# Patient Record
Sex: Female | Born: 1965 | Race: White | Hispanic: No | Marital: Married | Smoking: Never smoker
Health system: Southern US, Community
[De-identification: ages and names within clinical notes are randomized; demographics above are authoritative.]

## PROBLEM LIST (undated history)

## (undated) DIAGNOSIS — D219 Benign neoplasm of connective and other soft tissue, unspecified: Secondary | ICD-10-CM

## (undated) DIAGNOSIS — E7800 Pure hypercholesterolemia, unspecified: Secondary | ICD-10-CM

## (undated) DIAGNOSIS — E079 Disorder of thyroid, unspecified: Secondary | ICD-10-CM

## (undated) DIAGNOSIS — E042 Nontoxic multinodular goiter: Secondary | ICD-10-CM

## (undated) DIAGNOSIS — E559 Vitamin D deficiency, unspecified: Secondary | ICD-10-CM

## (undated) DIAGNOSIS — K812 Acute cholecystitis with chronic cholecystitis: Secondary | ICD-10-CM

## (undated) DIAGNOSIS — R5383 Other fatigue: Secondary | ICD-10-CM

## (undated) DIAGNOSIS — R1013 Epigastric pain: Secondary | ICD-10-CM

## (undated) DIAGNOSIS — N83201 Unspecified ovarian cyst, right side: Secondary | ICD-10-CM

## (undated) DIAGNOSIS — K219 Gastro-esophageal reflux disease without esophagitis: Secondary | ICD-10-CM

## (undated) DIAGNOSIS — E063 Autoimmune thyroiditis: Secondary | ICD-10-CM

## (undated) HISTORY — DX: Nontoxic multinodular goiter: E04.2

## (undated) HISTORY — PX: ABDOMINAL HYSTERECTOMY: SHX81

## (undated) HISTORY — DX: Epigastric pain: R10.13

## (undated) HISTORY — PX: TEMPOROMANDIBULAR JOINT SURGERY: SHX35

## (undated) HISTORY — DX: Autoimmune thyroiditis: E06.3

## (undated) HISTORY — DX: Gastro-esophageal reflux disease without esophagitis: K21.9

## (undated) HISTORY — DX: Acute cholecystitis with chronic cholecystitis: K81.2

## (undated) HISTORY — DX: Other fatigue: R53.83

## (undated) HISTORY — DX: Vitamin D deficiency, unspecified: E55.9

## (undated) HISTORY — DX: Pure hypercholesterolemia, unspecified: E78.00

## (undated) HISTORY — DX: Disorder of thyroid, unspecified: E07.9

## (undated) HISTORY — DX: Unspecified ovarian cyst, right side: N83.201

## (undated) HISTORY — DX: Benign neoplasm of connective and other soft tissue, unspecified: D21.9

---

## 1998-04-23 ENCOUNTER — Ambulatory Visit (HOSPITAL_BASED_OUTPATIENT_CLINIC_OR_DEPARTMENT_OTHER): Admission: RE | Admit: 1998-04-23 | Discharge: 1998-04-23 | Payer: Self-pay | Admitting: Surgery

## 1998-05-23 ENCOUNTER — Ambulatory Visit (HOSPITAL_COMMUNITY): Admission: RE | Admit: 1998-05-23 | Discharge: 1998-05-23 | Payer: Self-pay | Admitting: Surgery

## 1998-05-28 ENCOUNTER — Other Ambulatory Visit: Admission: RE | Admit: 1998-05-28 | Discharge: 1998-05-28 | Payer: Self-pay | Admitting: Gynecology

## 1998-12-07 ENCOUNTER — Other Ambulatory Visit: Admission: RE | Admit: 1998-12-07 | Discharge: 1998-12-07 | Payer: Self-pay | Admitting: Gynecology

## 1999-07-17 ENCOUNTER — Other Ambulatory Visit: Admission: RE | Admit: 1999-07-17 | Discharge: 1999-07-17 | Payer: Self-pay | Admitting: Gynecology

## 2000-09-08 ENCOUNTER — Other Ambulatory Visit: Admission: RE | Admit: 2000-09-08 | Discharge: 2000-09-08 | Payer: Self-pay | Admitting: Gynecology

## 2001-08-10 ENCOUNTER — Other Ambulatory Visit: Admission: RE | Admit: 2001-08-10 | Discharge: 2001-08-10 | Payer: Self-pay | Admitting: Gynecology

## 2001-09-28 ENCOUNTER — Ambulatory Visit (HOSPITAL_COMMUNITY): Admission: RE | Admit: 2001-09-28 | Discharge: 2001-09-28 | Payer: Self-pay | Admitting: Urology

## 2001-09-28 ENCOUNTER — Encounter: Payer: Self-pay | Admitting: Urology

## 2002-08-26 ENCOUNTER — Other Ambulatory Visit: Admission: RE | Admit: 2002-08-26 | Discharge: 2002-08-26 | Payer: Self-pay | Admitting: Obstetrics and Gynecology

## 2003-09-21 ENCOUNTER — Other Ambulatory Visit: Admission: RE | Admit: 2003-09-21 | Discharge: 2003-09-21 | Payer: Self-pay | Admitting: Obstetrics and Gynecology

## 2003-10-25 ENCOUNTER — Encounter: Admission: RE | Admit: 2003-10-25 | Discharge: 2003-10-25 | Payer: Self-pay | Admitting: Internal Medicine

## 2004-07-11 ENCOUNTER — Ambulatory Visit (HOSPITAL_COMMUNITY): Admission: RE | Admit: 2004-07-11 | Discharge: 2004-07-11 | Payer: Self-pay | Admitting: Internal Medicine

## 2004-08-02 ENCOUNTER — Encounter: Admission: RE | Admit: 2004-08-02 | Discharge: 2004-08-02 | Payer: Self-pay | Admitting: Internal Medicine

## 2004-10-14 ENCOUNTER — Other Ambulatory Visit: Admission: RE | Admit: 2004-10-14 | Discharge: 2004-10-14 | Payer: Self-pay | Admitting: Obstetrics and Gynecology

## 2005-03-24 ENCOUNTER — Ambulatory Visit: Payer: Self-pay | Admitting: "Endocrinology

## 2005-03-28 ENCOUNTER — Ambulatory Visit (HOSPITAL_COMMUNITY): Admission: RE | Admit: 2005-03-28 | Discharge: 2005-03-28 | Payer: Self-pay | Admitting: "Endocrinology

## 2005-04-04 ENCOUNTER — Ambulatory Visit: Payer: Self-pay | Admitting: "Endocrinology

## 2005-06-02 ENCOUNTER — Ambulatory Visit: Payer: Self-pay | Admitting: "Endocrinology

## 2005-08-12 ENCOUNTER — Ambulatory Visit: Payer: Self-pay | Admitting: "Endocrinology

## 2005-10-06 ENCOUNTER — Encounter (INDEPENDENT_AMBULATORY_CARE_PROVIDER_SITE_OTHER): Payer: Self-pay | Admitting: Specialist

## 2005-10-06 ENCOUNTER — Inpatient Hospital Stay (HOSPITAL_COMMUNITY): Admission: RE | Admit: 2005-10-06 | Discharge: 2005-10-08 | Payer: Self-pay | Admitting: Obstetrics and Gynecology

## 2005-11-03 ENCOUNTER — Ambulatory Visit: Payer: Self-pay | Admitting: "Endocrinology

## 2006-01-23 ENCOUNTER — Ambulatory Visit: Payer: Self-pay | Admitting: "Endocrinology

## 2006-04-27 ENCOUNTER — Ambulatory Visit: Payer: Self-pay | Admitting: "Endocrinology

## 2006-08-13 ENCOUNTER — Ambulatory Visit: Payer: Self-pay | Admitting: "Endocrinology

## 2006-08-26 ENCOUNTER — Encounter: Admission: RE | Admit: 2006-08-26 | Discharge: 2006-08-26 | Payer: Self-pay | Admitting: Gastroenterology

## 2006-11-25 ENCOUNTER — Other Ambulatory Visit: Admission: RE | Admit: 2006-11-25 | Discharge: 2006-11-25 | Payer: Self-pay | Admitting: Obstetrics and Gynecology

## 2006-12-14 ENCOUNTER — Ambulatory Visit: Payer: Self-pay | Admitting: "Endocrinology

## 2006-12-29 ENCOUNTER — Encounter: Admission: RE | Admit: 2006-12-29 | Discharge: 2006-12-29 | Payer: Self-pay | Admitting: Gastroenterology

## 2007-01-04 ENCOUNTER — Ambulatory Visit (HOSPITAL_COMMUNITY): Admission: RE | Admit: 2007-01-04 | Discharge: 2007-01-04 | Payer: Self-pay | Admitting: Gastroenterology

## 2007-03-15 ENCOUNTER — Ambulatory Visit: Payer: Self-pay | Admitting: "Endocrinology

## 2007-07-29 ENCOUNTER — Ambulatory Visit: Payer: Self-pay | Admitting: "Endocrinology

## 2007-11-01 ENCOUNTER — Ambulatory Visit: Payer: Self-pay | Admitting: "Endocrinology

## 2008-04-19 ENCOUNTER — Other Ambulatory Visit: Admission: RE | Admit: 2008-04-19 | Discharge: 2008-04-19 | Payer: Self-pay | Admitting: Obstetrics and Gynecology

## 2008-06-28 ENCOUNTER — Ambulatory Visit: Payer: Self-pay | Admitting: "Endocrinology

## 2008-09-21 ENCOUNTER — Ambulatory Visit: Payer: Self-pay | Admitting: Obstetrics and Gynecology

## 2008-11-27 ENCOUNTER — Ambulatory Visit: Payer: Self-pay | Admitting: "Endocrinology

## 2009-03-21 ENCOUNTER — Ambulatory Visit: Payer: Self-pay | Admitting: "Endocrinology

## 2009-05-01 ENCOUNTER — Other Ambulatory Visit: Admission: RE | Admit: 2009-05-01 | Discharge: 2009-05-01 | Payer: Self-pay | Admitting: Obstetrics and Gynecology

## 2009-05-01 ENCOUNTER — Encounter: Payer: Self-pay | Admitting: Obstetrics and Gynecology

## 2009-05-01 ENCOUNTER — Ambulatory Visit: Payer: Self-pay | Admitting: Obstetrics and Gynecology

## 2009-05-25 ENCOUNTER — Ambulatory Visit: Payer: Self-pay | Admitting: Obstetrics and Gynecology

## 2009-10-08 ENCOUNTER — Ambulatory Visit: Payer: Self-pay | Admitting: "Endocrinology

## 2010-02-10 ENCOUNTER — Encounter: Admission: RE | Admit: 2010-02-10 | Discharge: 2010-02-10 | Payer: Self-pay | Admitting: Family Medicine

## 2010-02-24 ENCOUNTER — Encounter: Admission: RE | Admit: 2010-02-24 | Discharge: 2010-02-24 | Payer: Self-pay | Admitting: Family Medicine

## 2010-04-22 ENCOUNTER — Ambulatory Visit: Payer: Self-pay | Admitting: Women's Health

## 2010-04-23 ENCOUNTER — Encounter: Admission: RE | Admit: 2010-04-23 | Discharge: 2010-04-23 | Payer: Self-pay | Admitting: Obstetrics and Gynecology

## 2010-06-18 ENCOUNTER — Other Ambulatory Visit: Admission: RE | Admit: 2010-06-18 | Discharge: 2010-06-18 | Payer: Self-pay | Admitting: Obstetrics and Gynecology

## 2010-06-18 ENCOUNTER — Ambulatory Visit: Payer: Self-pay | Admitting: Obstetrics and Gynecology

## 2010-06-28 ENCOUNTER — Ambulatory Visit: Payer: Self-pay | Admitting: Obstetrics and Gynecology

## 2010-07-01 ENCOUNTER — Ambulatory Visit: Payer: Self-pay | Admitting: Women's Health

## 2010-07-01 ENCOUNTER — Ambulatory Visit (HOSPITAL_COMMUNITY): Admission: RE | Admit: 2010-07-01 | Discharge: 2010-07-01 | Payer: Self-pay | Admitting: Obstetrics and Gynecology

## 2010-07-11 ENCOUNTER — Ambulatory Visit: Payer: Self-pay | Admitting: Women's Health

## 2010-08-05 ENCOUNTER — Ambulatory Visit: Payer: Self-pay | Admitting: "Endocrinology

## 2010-12-08 ENCOUNTER — Encounter: Payer: Self-pay | Admitting: Internal Medicine

## 2010-12-08 ENCOUNTER — Encounter: Payer: Self-pay | Admitting: Obstetrics and Gynecology

## 2011-03-12 ENCOUNTER — Encounter: Payer: Self-pay | Admitting: *Deleted

## 2011-03-12 ENCOUNTER — Other Ambulatory Visit: Payer: Self-pay | Admitting: *Deleted

## 2011-03-12 DIAGNOSIS — E038 Other specified hypothyroidism: Secondary | ICD-10-CM

## 2011-03-12 DIAGNOSIS — E7800 Pure hypercholesterolemia, unspecified: Secondary | ICD-10-CM | POA: Insufficient documentation

## 2011-03-12 DIAGNOSIS — E559 Vitamin D deficiency, unspecified: Secondary | ICD-10-CM

## 2011-04-04 NOTE — Op Note (Signed)
[name], [name]                  ACCOUNT NO.:  [id number]   MEDICAL RECORD NO.:  [id number]          PATIENT TYPE:  INP   LOCATION:  9304                          FACILITY:  [facility]   PHYSICIAN:  Daniel L. Gottsegen, M.D.DATE OF BIRTH:  1966-10-17   DATE OF PROCEDURE:  10/06/2005  DATE OF DISCHARGE:                                 OPERATIVE REPORT   PREOPERATIVE DIAGNOSIS:  Symptomatic fibroids.   POSTOPERATIVE DIAGNOSIS:  Symptomatic fibroids.   OPERATIONS:  Total abdominal hysterectomy.   SURGEON:  Dr. Eda Paschal   FIRST ASSISTANT:  Dr. Audie Box   FINDINGS:  The patient's uterus was enlarged by multiple fibroids, 12-14  weeks size. Ovaries, fallopian tubes, pelvic peritoneum and rest of the  exploration of the abdomen was completely unremarkable.   PROCEDURE:  The patient was taken to the operating room, placed in the  supine position and given general anesthesia. At this point attempt was made  to intubate the patient. It was extremely difficult intubation. It was  finally done with a fiberoptic assistance, please see anesthesia record for  more details. Once the patient had been adequately intubated. She was  prepped and draped in usual sterile manner. A Foley catheter was inserted in  the patient's bladder. Pfannenstiel incision was made. The fascia was opened  transversely. The peritoneum was entered vertically. Subcutaneous bleeders  were clamped and bovied as encountered. Round ligaments, utero-ovarian  ligaments and fallopian tubes were clamped, cut and doubly suture ligated  with #1 chromic catgut. The bladder flap was taken down by sharp dissection  as was the posterior peritoneum. The uterine arteries were doubly ligated  with #1 chromic catgut. The parametrium was taken down in successive bites  by clamping, cutting and suture ligating with #1 chromic catgut. Cervical  vaginal junction was identified entered with sharp dissection and the uterus  was sent to pathology  for tissue diagnosis. Angle sutures placed in the  angles of the vagina with #1 chromic catgut incorporating cardinal ligaments  and uterosacral ligaments for good vault support. The cuff was closed with  figure-of-eights of 0 Vicryl. Two sponge, needle, and instrument counts were  correct. Copious irrigation was done with sterile saline. The peritoneum was  closed with a running 0 Vicryl. The fascia was closed with two running 0  Vicryl. The skin was closed with staples. Estimated blood loss for entire  procedure was 250 mL with none replaced. The patient tolerated procedure  well. At the end of the procedure was draining clear urine from her Foley  catheter.      Daniel L. Eda Paschal, M.D.  Electronically Signed     DLG/MEDQ  D:  10/06/2005  T:  10/06/2005  Job:  [id number]

## 2011-04-04 NOTE — H&P (Signed)
[name], [name]                  ACCOUNT NO.:  [id number]   MEDICAL RECORD NO.:  [id number]           PATIENT TYPE:   LOCATION:                                 FACILITY:   PHYSICIAN:  Daniel L. Eda Paschal, M.D.   DATE OF BIRTH:   DATE OF ADMISSION:  10/05/2005  DATE OF DISCHARGE:                                HISTORY & PHYSICAL   CHIEF COMPLAINT:  Symptomatic fibroids.   HISTORY OF PRESENT ILLNESS:  Patient is a 45 year old gravida 2, para 2, AB0  who has started to have significant symptoms from fibroids which have grown  over the past year.  Her symptoms include persistent pelvic discomfort,  persistent pressure on her bladder, abdominal bloating, feeling that  everything is actually falling out, aggravation of her interstitial  cystitis.  These symptoms were confirmed with findings of multiple fibroids.  On ultrasound patient has at least six fairly good sized fibroids.  Her  ovaries appeared normal.  She had been seen initially by Dr. Carlis [name] in  [facility] who felt that her symptoms were due to fibroids and recommended  hysterectomy.  She was seen by me and I concurred in the above.  As a result  of this she now enters the hospital for total abdominal hysterectomy.  We  plan to conserve both ovaries unless there is disease.  She has given me  permission to remove one or both ovaries for very, very significant disease.   PAST MEDICAL HISTORY:  1.  Patient is status post cesarean section and also surgery for TMJ.  2.  She is hypothyroid.  3.  She has GERD.  4.  She has multiple allergies.   CURRENT MEDICATIONS:  1.  Allegra.  2.  Lexapro 20 mg daily.  3.  Synthroid 50 mcg daily.  4.  Nexium 40 mg daily.   She is allergic to PENICILLIN, BIAXIN, DOXYCYCLINE, CODEINE, and ADHESIVE  TAPE.   FAMILY HISTORY:  Grandfather with coronary artery disease, otherwise family  history is basically noncontributory.   SOCIAL HISTORY:  She is a nonsmoker, nondrinker.   REVIEW  OF SYSTEMS:  HEENT:  Negative except for allergies.  CARDIAC:  Negative.  RESPIRATORY:  Negative.  GI:  Reflux.  GU:  Negative except for  interstitial cystitis.  NEUROMUSCULAR, ENDOCRINE, ALLERGIES:  See above.   PHYSICAL EXAMINATION:  GENERAL:  Patient is a well-developed and well-  nourished female in no acute distress.  VITAL SIGNS:  Blood pressure 130/70, her pulse is 80 and regular,  respirations 16 and nonlabored.  She is afebrile.  HEENT:  All within normal limits.  NECK:  Supple.  Trachea in the midline.  Thyroid not enlarged.  LUNGS:  Clear to P&A.  HEART:  No thrills, heaves, or murmurs.  BREASTS:  No masses.  ABDOMEN:  Soft without guarding, rebound, or masses.  PELVIC:  External and vaginal is normal.  Cervix is clean.  Pap smear showed  no atypia.  Uterus is enlarged by fibroids 10-11 weeks size.  Adnexa are  palpably normal.  RECTAL:  Negative.  EXTREMITIES:  Within normal limits.   ADMISSION IMPRESSION:  Symptomatic fibroids.   PLAN:  Total abdominal hysterectomy.      Daniel L. Eda Paschal, M.D.  Electronically Signed     DLG/MEDQ  D:  10/06/2005  T:  10/06/2005  Job:  [id number]

## 2011-04-16 ENCOUNTER — Other Ambulatory Visit: Payer: Self-pay | Admitting: *Deleted

## 2011-04-16 DIAGNOSIS — E038 Other specified hypothyroidism: Secondary | ICD-10-CM

## 2011-04-19 LAB — LIPID PANEL: VLDL: 10 mg/dL (ref 0–40)

## 2011-04-22 ENCOUNTER — Ambulatory Visit: Payer: Self-pay | Admitting: "Endocrinology

## 2011-04-23 ENCOUNTER — Telehealth: Payer: Self-pay | Admitting: *Deleted

## 2011-04-23 NOTE — Telephone Encounter (Signed)
Patient notified by phone of results.

## 2011-05-26 ENCOUNTER — Ambulatory Visit (INDEPENDENT_AMBULATORY_CARE_PROVIDER_SITE_OTHER): Payer: BC Managed Care – PPO | Admitting: "Endocrinology

## 2011-05-26 VITALS — BP 96/53 | HR 109 | Wt 142.6 lb

## 2011-05-26 DIAGNOSIS — R5381 Other malaise: Secondary | ICD-10-CM

## 2011-05-26 DIAGNOSIS — E049 Nontoxic goiter, unspecified: Secondary | ICD-10-CM

## 2011-05-26 DIAGNOSIS — E063 Autoimmune thyroiditis: Secondary | ICD-10-CM

## 2011-05-26 DIAGNOSIS — E7800 Pure hypercholesterolemia, unspecified: Secondary | ICD-10-CM

## 2011-05-26 DIAGNOSIS — R5383 Other fatigue: Secondary | ICD-10-CM

## 2011-05-26 DIAGNOSIS — E038 Other specified hypothyroidism: Secondary | ICD-10-CM

## 2011-05-26 DIAGNOSIS — E7801 Familial hypercholesterolemia: Secondary | ICD-10-CM

## 2011-05-26 NOTE — Patient Instructions (Signed)
Please have thyrorid lab tests done in two months. Please have thyroid labs and lipids drawn tne week prior to next appointment. Please fast after 10 PM on night prior to the second set of labs tests.

## 2011-06-20 ENCOUNTER — Other Ambulatory Visit (HOSPITAL_COMMUNITY): Payer: Self-pay | Admitting: Gastroenterology

## 2011-06-20 DIAGNOSIS — R1084 Generalized abdominal pain: Secondary | ICD-10-CM

## 2011-06-27 ENCOUNTER — Encounter (HOSPITAL_COMMUNITY)
Admission: RE | Admit: 2011-06-27 | Discharge: 2011-06-27 | Disposition: A | Discharge status: Home / Self Care | Payer: BC Managed Care – PPO | Source: Ambulatory Visit | Attending: Gastroenterology | Admitting: Gastroenterology

## 2011-06-27 DIAGNOSIS — R1084 Generalized abdominal pain: Secondary | ICD-10-CM | POA: Insufficient documentation

## 2011-06-27 MED ORDER — TECHNETIUM TC 99M MEBROFENIN IV KIT
5.0000 | PACK | Freq: Once | INTRAVENOUS | Status: AC | PRN
  Administered 2011-06-27: 5 via INTRAVENOUS

## 2011-07-07 ENCOUNTER — Encounter: Payer: Self-pay | Admitting: Gynecology

## 2011-07-07 DIAGNOSIS — E079 Disorder of thyroid, unspecified: Secondary | ICD-10-CM | POA: Insufficient documentation

## 2011-07-07 DIAGNOSIS — N94819 Vulvodynia, unspecified: Secondary | ICD-10-CM | POA: Insufficient documentation

## 2011-07-07 DIAGNOSIS — E7800 Pure hypercholesterolemia, unspecified: Secondary | ICD-10-CM | POA: Insufficient documentation

## 2011-07-07 DIAGNOSIS — D219 Benign neoplasm of connective and other soft tissue, unspecified: Secondary | ICD-10-CM | POA: Insufficient documentation

## 2011-07-16 ENCOUNTER — Encounter: Payer: Self-pay | Admitting: Obstetrics and Gynecology

## 2011-07-16 ENCOUNTER — Ambulatory Visit (INDEPENDENT_AMBULATORY_CARE_PROVIDER_SITE_OTHER): Payer: BC Managed Care – PPO | Admitting: Obstetrics and Gynecology

## 2011-07-16 ENCOUNTER — Other Ambulatory Visit (HOSPITAL_COMMUNITY)
Admission: RE | Admit: 2011-07-16 | Discharge: 2011-07-16 | Disposition: A | Discharge status: Home / Self Care | Payer: BC Managed Care – PPO | Source: Ambulatory Visit | Attending: Obstetrics and Gynecology | Admitting: Obstetrics and Gynecology

## 2011-07-16 VITALS — BP 120/70 | Ht 61.5 in | Wt 143.0 lb

## 2011-07-16 DIAGNOSIS — Z01419 Encounter for gynecological examination (general) (routine) without abnormal findings: Secondary | ICD-10-CM

## 2011-07-16 DIAGNOSIS — N898 Other specified noninflammatory disorders of vagina: Secondary | ICD-10-CM

## 2011-07-16 DIAGNOSIS — K810 Acute cholecystitis: Secondary | ICD-10-CM | POA: Insufficient documentation

## 2011-07-16 DIAGNOSIS — R823 Hemoglobinuria: Secondary | ICD-10-CM

## 2011-07-16 DIAGNOSIS — B373 Candidiasis of vulva and vagina: Secondary | ICD-10-CM

## 2011-07-16 DIAGNOSIS — B3731 Acute candidiasis of vulva and vagina: Secondary | ICD-10-CM

## 2011-07-16 MED ORDER — TERCONAZOLE 0.8 % VA CREA: 1.0000 | TOPICAL_CREAM | Freq: Every day | VAGINAL | Status: AC

## 2011-07-16 NOTE — Progress Notes (Signed)
The patient came to see me today for an annual GYN exam. She is having some vaginal discharge which is her that normal. She is up-to-date on mammograms. Other than that she is no GYN problems. She did get sick on vacation and was diagnosed with acute cholecystitis with LFTs elevated. She is now seen by Jeannetta Ellis and it appears that she is getting better and will not need surgery.  HEENT: Within normal limits. Neck: No masses. Supraclavicular lymph nodes: Not enlarged. Breasts: Examined in both sitting and lying position. Symmetrical without skin changes or masses. Abdomen: Soft no masses guarding or rebound. No hernias. Pelvic: External within normal limits. BUS within normal limits. Vaginal examination shows good estrogen effect, no cystocele enterocele or rectocele. Wet prep positive for yeast. Cervix and uterus absent. Adnexa within normal limits. Rectovaginal confirmatory. Extremities within normal limits.  Assessment: Yeast vaginitis  Plan: Terconazole 3 cream.

## 2011-08-06 ENCOUNTER — Ambulatory Visit (INDEPENDENT_AMBULATORY_CARE_PROVIDER_SITE_OTHER): Payer: BC Managed Care – PPO | Admitting: Women's Health

## 2011-08-06 DIAGNOSIS — R823 Hemoglobinuria: Secondary | ICD-10-CM

## 2011-08-06 DIAGNOSIS — E079 Disorder of thyroid, unspecified: Secondary | ICD-10-CM

## 2011-08-06 DIAGNOSIS — M549 Dorsalgia, unspecified: Secondary | ICD-10-CM

## 2011-08-06 LAB — TSH: TSH: 1.01 u[IU]/mL (ref 0.350–4.500)

## 2011-08-06 LAB — T3, FREE: T3, Free: 3.1 pg/mL (ref 2.3–4.2)

## 2011-08-06 NOTE — Progress Notes (Signed)
  Presents with a complaint of right lower quadrant pain that's been off and on for several days, crampy, dull ache. Denies a fever, no nausea, vomiting or constipation. Has back discomfort especially in the lower lumbar area. She had pain similar to this about a year ago where she had a  CT scan with a normal appendix, but with questionable gallstones.She did have a pelvic ultrasound that did not show a cyst but she has had cysts in the past.She has followup with her GI Dr.Hayes on Monday  September 24,2012.  No CVAT, pain was more in the lumbar area. Abdomen was soft she did have discomfort in the lower right quadrant. External genitalia is within normal limits, speculum exam scant discharge no erythema or odor was noted. Bimanual she has had a hysterectomy for fibroids, no discomfort on the left adnexal area slight discomfort at the right above the area of the ovary.  UA rare wbc's will check a urine culture, will check a CBC,( TSH free T4 and free T3 her requested from her endocrinologist). Ultrasound to rule out ovarian cysts, keep scheduled followup with GI Dr. due to history of possible gallstones.

## 2011-08-08 ENCOUNTER — Ambulatory Visit (INDEPENDENT_AMBULATORY_CARE_PROVIDER_SITE_OTHER): Payer: BC Managed Care – PPO | Admitting: Women's Health

## 2011-08-08 ENCOUNTER — Ambulatory Visit: Payer: BC Managed Care – PPO

## 2011-08-08 DIAGNOSIS — N83209 Unspecified ovarian cyst, unspecified side: Secondary | ICD-10-CM

## 2011-08-08 DIAGNOSIS — M549 Dorsalgia, unspecified: Secondary | ICD-10-CM

## 2011-08-08 DIAGNOSIS — R1031 Right lower quadrant pain: Secondary | ICD-10-CM

## 2011-08-08 NOTE — Progress Notes (Signed)
  Was seen in the office on September 19 ,dull, achy discomfort in the lower right quadrant, and lower back area that radiated to her pelvis. She states she has had this pain off and on for one year. She did have a negative workup August of 2011, with CT, ultrasound, and did see her GI Dr.  Aubery Lapping today does show negative vaginal cuff history of hysterectomy for fibroids. Left ovary normal with the follicle. The right ovary a complex thin-walled avascular mass 42 x 32 x 41 mm questionable endometrioma.  Will check a CA 125, schedule a followup with Dr. Eda Paschal to discuss.

## 2011-08-09 LAB — CA 125: CA 125: 11 U/mL (ref 0.0–30.2)

## 2011-08-12 ENCOUNTER — Ambulatory Visit (INDEPENDENT_AMBULATORY_CARE_PROVIDER_SITE_OTHER): Payer: BC Managed Care – PPO | Admitting: Obstetrics and Gynecology

## 2011-08-12 DIAGNOSIS — N83209 Unspecified ovarian cyst, unspecified side: Secondary | ICD-10-CM

## 2011-08-12 DIAGNOSIS — M549 Dorsalgia, unspecified: Secondary | ICD-10-CM

## 2011-08-12 NOTE — Progress Notes (Signed)
Patient came back today discussed with her pain and her ultrasound findings. I told her that on ultrasound her cyst appeared benign. Her CA 125 was normal. She is having terrific upper and lower back pain. She had a previous attack when she was in [place] of we'll was thought to be gallbladder disease. She is now seen Dr. Madilyn Fireman and so far no definite diagnosis has been made. I told her that I thought for the moment removing this ovarian cyst was not appropriate. She is very concerned that it could be a malignancy. We are going to send her to Dr. Neomia Glass for her opinion on whether it suspicious and whether it's a source of her pain. She will also go back and see Dr. Madilyn Fireman and see if he thinks this may be gallbladder disease. We will then decide what to do after this. We had a 30 minute discussion of all the above including what surgery would consist of it was done. Depending on when she has her appointment with the oncologist I told her we may want rescan her in the office to see if there's been any change.

## 2011-08-18 ENCOUNTER — Other Ambulatory Visit: Payer: Self-pay | Admitting: Gastroenterology

## 2011-08-19 ENCOUNTER — Other Ambulatory Visit: Payer: Self-pay | Admitting: *Deleted

## 2011-08-19 ENCOUNTER — Encounter: Payer: Self-pay | Admitting: *Deleted

## 2011-08-19 ENCOUNTER — Ambulatory Visit
Admission: RE | Admit: 2011-08-19 | Discharge: 2011-08-19 | Disposition: A | Discharge status: Home / Self Care | Payer: BC Managed Care – PPO | Source: Ambulatory Visit | Attending: Gastroenterology | Admitting: Gastroenterology

## 2011-08-19 DIAGNOSIS — N83209 Unspecified ovarian cyst, unspecified side: Secondary | ICD-10-CM

## 2011-08-19 NOTE — Progress Notes (Signed)
Trellis Paganini, MD More Detail >>      Trellis Paganini, MD        Sent: Tue August 12, 2011  3:59 PM    To: Kynan Peasley Duwaine Maxin, CNA          Patient was scheduled with Dr. Duard Brady on Oct 22 @1pm  in [place].  Patient informed, records faxed.    Message     This is very high priority. Please make patient appointment to see Dr. Rockney Ghee who is a GYN oncologist who goes to [facility]. She is a right ovarian cyst with pain. She is very anxious about this and would like to be seen soon. If one of the other oncologist can see her sooner than that'll be okay also. She is unavailable from October 7 of October 18 and from October 31 of November 2. Other than that she's available.

## 2011-08-21 ENCOUNTER — Other Ambulatory Visit: Payer: Self-pay | Admitting: Obstetrics and Gynecology

## 2011-08-21 ENCOUNTER — Telehealth: Payer: Self-pay | Admitting: Obstetrics and Gynecology

## 2011-08-21 DIAGNOSIS — M549 Dorsalgia, unspecified: Secondary | ICD-10-CM

## 2011-08-21 DIAGNOSIS — N83209 Unspecified ovarian cyst, unspecified side: Secondary | ICD-10-CM

## 2011-08-21 NOTE — Telephone Encounter (Signed)
I called the patient today to see how she was doing. She said her back pain is much better. She saw  Dr. Madilyn Fireman on Monday and had an ultrasound of her gallbladder on Tuesday. It showed polyps her gallbladder. He didn't give her an opinion as to whether he thought it was causing her pain or not. She has an appointment with me on Oct. 25 for ultrasound and discussion. She has an appointment with the gyn oncologist at [facility] on oct 29. I suggested today that instead of doing another ultrasound we schedule an MRI of her pelvis for a different imaging technique to rule out a suspicious ovarian cyst. She was agreeable. We will schedule it.

## 2011-08-21 NOTE — Telephone Encounter (Signed)
Appointment for MRI was set for 09/05/11 @ 8am at [facility].  Patient was informed and pre-cert done.

## 2011-09-05 ENCOUNTER — Ambulatory Visit (HOSPITAL_COMMUNITY)
Admission: RE | Admit: 2011-09-05 | Discharge: 2011-09-05 | Disposition: A | Discharge status: Home / Self Care | Payer: BC Managed Care – PPO | Source: Ambulatory Visit | Attending: Obstetrics and Gynecology | Admitting: Obstetrics and Gynecology

## 2011-09-05 DIAGNOSIS — M549 Dorsalgia, unspecified: Secondary | ICD-10-CM | POA: Insufficient documentation

## 2011-09-05 DIAGNOSIS — N831 Corpus luteum cyst of ovary, unspecified side: Secondary | ICD-10-CM | POA: Insufficient documentation

## 2011-09-05 DIAGNOSIS — N949 Unspecified condition associated with female genital organs and menstrual cycle: Secondary | ICD-10-CM | POA: Insufficient documentation

## 2011-09-05 DIAGNOSIS — N83209 Unspecified ovarian cyst, unspecified side: Secondary | ICD-10-CM

## 2011-09-05 MED ORDER — GADOBENATE DIMEGLUMINE 529 MG/ML IV SOLN
13.0000 mL | Freq: Once | INTRAVENOUS | Status: AC
  Administered 2011-09-05: 13 mL via INTRAVENOUS

## 2011-09-11 ENCOUNTER — Other Ambulatory Visit: Payer: BC Managed Care – PPO

## 2011-09-11 ENCOUNTER — Ambulatory Visit: Payer: BC Managed Care – PPO | Admitting: Obstetrics and Gynecology

## 2011-09-25 ENCOUNTER — Ambulatory Visit (INDEPENDENT_AMBULATORY_CARE_PROVIDER_SITE_OTHER): Payer: BC Managed Care – PPO | Admitting: Internal Medicine

## 2011-09-25 ENCOUNTER — Encounter: Payer: Self-pay | Admitting: Internal Medicine

## 2011-09-25 VITALS — BP 104/64 | HR 69 | Temp 98.2°F | Ht 61.0 in | Wt 145.0 lb

## 2011-09-25 DIAGNOSIS — N83201 Unspecified ovarian cyst, right side: Secondary | ICD-10-CM | POA: Insufficient documentation

## 2011-09-25 DIAGNOSIS — F419 Anxiety disorder, unspecified: Secondary | ICD-10-CM | POA: Insufficient documentation

## 2011-09-25 DIAGNOSIS — R7989 Other specified abnormal findings of blood chemistry: Secondary | ICD-10-CM

## 2011-09-25 DIAGNOSIS — K5900 Constipation, unspecified: Secondary | ICD-10-CM | POA: Insufficient documentation

## 2011-09-25 DIAGNOSIS — F411 Generalized anxiety disorder: Secondary | ICD-10-CM

## 2011-09-25 DIAGNOSIS — E069 Thyroiditis, unspecified: Secondary | ICD-10-CM

## 2011-09-25 NOTE — Patient Instructions (Signed)
Take one Benadryl 25 mg  For sleep 2-3 times per week.  If necessary can go to two a night  Schedule complete exam with me  Labs will be mailed to you
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
## 2011-09-26 LAB — COMPREHENSIVE METABOLIC PANEL
AST: 17 U/L (ref 0–37)
BUN: 11 mg/dL (ref 6–23)
CO2: 26 mEq/L (ref 19–32)
Calcium: 9.3 mg/dL (ref 8.4–10.5)
Chloride: 105 mEq/L (ref 96–112)
Creat: 0.61 mg/dL (ref 0.50–1.10)
Total Bilirubin: 0.4 mg/dL (ref 0.3–1.2)

## 2011-09-26 LAB — VITAMIN D 25 HYDROXY (VIT D DEFICIENCY, FRACTURES): Vit D, 25-Hydroxy: 33 ng/mL (ref 30–89)

## 2011-09-29 ENCOUNTER — Encounter: Payer: Self-pay | Admitting: Emergency Medicine

## 2011-10-02 ENCOUNTER — Ambulatory Visit
Admission: RE | Admit: 2011-10-02 | Discharge: 2011-10-02 | Disposition: A | Discharge status: Home / Self Care | Payer: BC Managed Care – PPO | Source: Ambulatory Visit | Attending: Internal Medicine | Admitting: Internal Medicine

## 2011-10-06 ENCOUNTER — Encounter: Payer: Self-pay | Admitting: Internal Medicine

## 2011-10-06 ENCOUNTER — Telehealth: Payer: Self-pay | Admitting: Internal Medicine

## 2011-10-06 NOTE — Telephone Encounter (Signed)
Spoke with pt. And informed of thyroid results.  She has upcoming appt with endocrinologist and she will go over results with him.   I faxed report to pt.   She voices understanding and willl keep appt with her endocrinologist

## 2011-10-20 ENCOUNTER — Encounter: Payer: Self-pay | Admitting: Internal Medicine

## 2011-10-20 ENCOUNTER — Ambulatory Visit (INDEPENDENT_AMBULATORY_CARE_PROVIDER_SITE_OTHER): Payer: BC Managed Care – PPO | Admitting: Internal Medicine

## 2011-10-20 VITALS — BP 112/68 | HR 68 | Temp 98.4°F | Ht 61.0 in | Wt 146.0 lb

## 2011-10-20 DIAGNOSIS — R05 Cough: Secondary | ICD-10-CM

## 2011-10-20 DIAGNOSIS — J40 Bronchitis, not specified as acute or chronic: Secondary | ICD-10-CM

## 2011-10-20 DIAGNOSIS — F419 Anxiety disorder, unspecified: Secondary | ICD-10-CM

## 2011-10-20 DIAGNOSIS — R059 Cough, unspecified: Secondary | ICD-10-CM

## 2011-10-20 DIAGNOSIS — F411 Generalized anxiety disorder: Secondary | ICD-10-CM

## 2011-10-20 MED ORDER — ALPRAZOLAM 0.5 MG PO TABS: ORAL_TABLET | ORAL | Status: DC

## 2011-10-20 MED ORDER — SULFAMETHOXAZOLE-TRIMETHOPRIM 800-160 MG PO TABS: 1.0000 | ORAL_TABLET | Freq: Two times a day (BID) | ORAL | Status: AC

## 2011-10-20 MED ORDER — BENZONATATE 100 MG PO CAPS: 100.0000 mg | ORAL_CAPSULE | Freq: Four times a day (QID) | ORAL | Status: DC | PRN

## 2011-10-20 NOTE — Progress Notes (Signed)
Subjective:    Patient ID: [name], female    DOB: [birth year], 45 y.o.   MRN: [id number]  HPI  Acute visit.  Sore throat, cough lots of nasal congestion.  No documented fever.   She is scared that she will pass this to her daughter who has immune suppression.  She is stressed and extremely sleep deprived  And her husband is in [place] right now recruiting for the [employer].  She has had little sleep .   Tired melatonin and it didn't work  No chest pain, no SOB  Cough dry now  Allergies  Allergen Reactions  . Biaxin Diarrhea    Chest pain  . Codeine Diarrhea    Chest pain  . Doxycycline Diarrhea    Chest pain  . Levaquin     diarrhea  . Penicillins Diarrhea    Chest pain  . Zetia (Ezetimibe) Other (See Comments)    Abdominal pain  . Tape Rash    Sunburn like rash   Past Medical History  Diagnosis Date  . Thyroid disease     Hypothyroid  . Elevated cholesterol   . Fibroid   . Vulvodynia   . Cholecystitis chronic, acute   . Ovarian cyst, right   . GERD (gastroesophageal reflux disease)   . Constipation    Past Surgical History  Procedure Date  . Abdominal hysterectomy     TAH  . Temporomandibular joint surgery   . Cesarean section    History   Social History  . Marital Status: Married    Spouse Name: N/A    Number of Children: N/A  . Years of Education: N/A   Occupational History  . Not on file.   Social History Main Topics  . Smoking status: Never Smoker   . Smokeless tobacco: Never Used  . Alcohol Use: 0.6 oz/week    1 Glasses of wine per week     occasional  . Drug Use: No  . Sexually Active: Yes    Birth Control/ Protection: Surgical   Other Topics Concern  . Not on file   Social History Narrative  . No narrative on file   Family History  Problem Relation Age of Onset  . Thyroid disease Mother   . Thyroid disease Maternal Grandmother    Patient Active Problem List  Diagnoses  . Other specified acquired hypothyroidism    . Pure hypercholesterolemia  . Unspecified vitamin D deficiency  . Thyroid disease  . Elevated cholesterol  . Fibroid  . Vulvodynia  . Acute cholecystitis  . Ovarian cyst, right  . Constipation  . Anxiety   Current Outpatient Prescriptions on File Prior to Visit  Medication Sig Dispense Refill  . Calcium Carbonate-Vitamin D (CALCIUM + D PO) Take by mouth 2 (two) times daily.        . lansoprazole (PREVACID) 30 MG capsule Take 30 mg by mouth daily.        Marland Kitchen levothyroxine (SYNTHROID, LEVOTHROID) 50 MCG tablet Take 50 mcg by mouth daily. Brand name Synthroid Only       . Magnesium Hydroxide (MILK OF MAGNESIA PO) Take 2 tablets by mouth at bedtime.        . Multiple Vitamin (MULTIVITAMIN) tablet Take 1 tablet by mouth daily.        . Probiotic Product (PROBIOTIC PO) Take 1 tablet by mouth daily.        Marland Kitchen ALPRAZolam (XANAX) 0.5 MG tablet Take 0.5 mg by mouth as needed.  Take 1/2 pill prn       . fish oil-omega-3 fatty acids 1000 MG capsule Take 2 g by mouth daily.        Marland Kitchen omega-3 acid ethyl esters (LOVAZA) 1 G capsule Take 2 g by mouth daily.        . Rosuvastatin Calcium (CRESTOR PO) Take by mouth daily.            Review of Systems    see HPI Objective:   Physical Exam Physical Exam  Constitutional: She is oriented to person, place, and time. She appears well-developed and well-nourished. She is cooperative.  HENT:  Head: Normocephalic and atraumatic.  Right Ear: A middle ear effusion is present.  Left Ear: A middle ear effusion is present.  Nose: Mucosal edema present. Right sinus exhibits maxillary sinus tenderness. Left sinus exhibits maxillary sinus tenderness.  Mouth/Throat: Posterior oropharyngeal erythema present.  Serous effusion bilaterally  Eyes: Conjunctivae and EOM are normal. Pupils are equal, round, and reactive to light.  Neck: Neck supple. Carotid bruit is not present. No mass present.  Cardiovascular: Regular rhythm, normal heart sounds, intact distal  pulses and normal pulses. Exam reveals no gallop and no friction rub.  No murmur heard.  Pulmonary/Chest: Breath sounds normal. She has no wheezes. She has no rhonchi. She has no rales.  Neurological: She is alert and oriented to person, place, and time.  Skin: Skin is warm and dry. No abrasion, no bruising, no ecchymosis and no rash noted. No cyanosis. Nails show no clubbing.  Psychiatric: She has a normal mood and affect. Her speech is normal and behavior is normal.             Assessment & Plan:  Bronchitis  OK for Bactrim DS 1 bid for 10 days,  Tessalon perles for cough 2)  Insomnia  Related to stress/anxiety 3)  Anxiety:  OK for Xanax 0.5 mg bid.  May need Clonazepam for longer acting in the future

## 2011-10-20 NOTE — Patient Instructions (Addendum)
Call office on Thursday to report condition  /take Xanax bid as needed  Afrin OTC bid

## 2011-10-28 ENCOUNTER — Ambulatory Visit: Payer: BC Managed Care – PPO | Admitting: Internal Medicine

## 2011-10-29 ENCOUNTER — Telehealth: Payer: Self-pay

## 2011-10-29 DIAGNOSIS — F419 Anxiety disorder, unspecified: Secondary | ICD-10-CM

## 2011-10-29 NOTE — Telephone Encounter (Signed)
Spoke with [employer].  She states that the Xanax is helping her go to sleep, but she is still not sleeping through the night.  Generally, she takes the Xanax around 8pm, then will go to bed between 930-10pm.  She will sleep until about 3-4am then will wake up.  She is wide awake.  Eventually, she is able to go back to sleep, but does not feel rested in the morning.  She states that you discussed a longer acting medication with her and she would like to try that.

## 2011-10-29 NOTE — Telephone Encounter (Signed)
LMOVM for [name] to return call to the office

## 2011-10-30 MED ORDER — CLONAZEPAM 1 MG PO TABS: 30.0000 mg | ORAL_TABLET | Freq: Every evening | ORAL | Status: DC | PRN

## 2011-10-30 MED ORDER — CLONAZEPAM 1 MG PO TABS: 1.0000 mg | ORAL_TABLET | Freq: Every evening | ORAL | Status: DC | PRN

## 2011-10-30 NOTE — Telephone Encounter (Signed)
Ok to try Clonazepam 1/2 or 1 whole tablet  hs prn .  If anxiety severe can have 1/2 in the daytime but wait a full 12 hours in between doses  Gavin Pound   Please call this in for her and call Seleena with the above instructions

## 2011-10-30 NOTE — Telephone Encounter (Signed)
rx called to [facility] MD voice mail per DDS.  Damika aware of instructions, will start with 1/2 tab at bedtime PRN and increase to 1 tab if needed

## 2011-11-22 ENCOUNTER — Encounter: Payer: Self-pay | Admitting: "Endocrinology

## 2011-11-22 DIAGNOSIS — E049 Nontoxic goiter, unspecified: Secondary | ICD-10-CM | POA: Insufficient documentation

## 2011-11-22 DIAGNOSIS — R1013 Epigastric pain: Secondary | ICD-10-CM | POA: Insufficient documentation

## 2011-11-22 DIAGNOSIS — E063 Autoimmune thyroiditis: Secondary | ICD-10-CM | POA: Insufficient documentation

## 2011-11-22 DIAGNOSIS — E559 Vitamin D deficiency, unspecified: Secondary | ICD-10-CM | POA: Insufficient documentation

## 2011-11-22 DIAGNOSIS — E042 Nontoxic multinodular goiter: Secondary | ICD-10-CM | POA: Insufficient documentation

## 2011-11-22 DIAGNOSIS — R5383 Other fatigue: Secondary | ICD-10-CM | POA: Insufficient documentation

## 2011-11-22 NOTE — Progress Notes (Addendum)
Subjective:  Patient Name: [name] Date of Birth: 21-Apr-1966  MRN: [id number]  [name]  presents to the office today for follow-up of her Follow-up   HISTORY OF PRESENT ILLNESS:   [name] is a 46 y.o. Caucasian woman.   [name] was accompanied by her daughter.  1. The patient was first referred to me on 03/24/05 by her primary care physician, Dr. Allena Napoleon, for evaluation of a painful goiter and abnormal thyroid blood tests, possibly consistent with hyperthyroidism.   A. She was first recognized to have a goiter about 12 years previously when she was pregnant. Over time the goiter has waxed and waned in size, but gradually has become larger. At times when the thyroid gland is larger and more swollen, even mild direct pressure is very uncomfortable. She was diagnosed with hypothyroidism of 10-11 years previously. She was placed on Cytomel, but that caused her to be a "freaky lunatic". She was subsequently converted to Armour Thyroid. Patient also had one lab tests which show that she might have a low cortisol level. Therefore she had been started on Cortef, 10 mg per day.  B. At our first visit "she described herself as being "exhausted". Her energy was low. She was not sleeping very well. She was restless and frequently awakened during the night. She stated she snored like a truck driver. She had frequent night sweats. She occasionally noted that her heart rate was very fast and racing at times that such fast heart rate would not be expected. She was having a lot of reflux and dyspepsia symptoms that were being treated with Nexium. She was nervous and tremulous a lot. She was on an emotional roller coaster. She had frequent problems with concentrating and remembering. She had a mild problem with hirsutism of her chin and upper lip. She also was on Lexapro for vulvovaginodynia. She has allergies to penicillin, Biaxin, doxycycline, and many adhesives. Her ethnicity was Ashken[place] Jewish. Family  history was positive for hypothyroidism in her mother, maternal grandmother and sister. Her paternal grandfather had diabetes. On physical examination, she looked tired. She had a 25-30 g gland. The thyroid gland was soft. Her thyroid gland was uncomfortable to palpation in both lobes. She showed no evidence of hyperpigmentation of the buccal mucosa, tongue, or skin. She had trace tremor of her hands.  C. Review of laboratory data dating back to 07/08/2004 showed that she had been persistently hyperthyroid from the point of view of her TSH. On 07/13/04 her TSH was 0.151, her free T4 was 1.01, her free T3 was 2.7. On 09/21/04 her TSH was 0.012. Her free T4 was 1.25. On 03/18/05 the TSH was 0.212, free T4 was 0.88, and free T3 was 2.7. Her TPO antibody was mildly elevated at 40.4. The pattern of abnormal, shifting TFTs was c/w flare-ups of Hashimoto's disease. However, since Armour Thyroid was dosed by iodine content rather than T4/T3 ratio, Armour thyroid could also contribute to unstable TFTs. I therefore discontinued her Armour Thyroid and began treatment with Synthroid, initially at 112 mcg/day, nut rapidly tapering to 88 mcg/day..  D. To rule out insufficiency of the Hypothalamic-pituitary-adrenal axis, an ACTH stimulation test was performed on 03/28/05. At time 0, her ACTH was 12 and her cortisol was 13.1. At time +30 minutes her cortisol increased to 24.5. At time +60 minutes her cortisol increased even further to 29.1. This was a perfectly normal test of the HPA axis. I therefore discontinued her cortisol treatment.  2. During the past six  years, we've followed her TFTs aout every 3-6 months, and we've tapered her Synthroid dose as needed. Her most recent Synthrid doe regimen is 50 mcg 4 days per week and 25 mcg on one of the other days of the week. We've found it difficult to treat her hypercholesterolemia because she's had adverse reactions to all of the statins we've tried and to Zetia as well. On the  positive side, since her last [facility] clinic visit in 08/05/10, she's decided to change her eating habits and to exercise more. As a result she's been losing weight in a healthy way and has been feeling more energy and vitality. Unfortunately, she had an attack of apparent cholecystitis last week. She will be evaluated by general surgery.  3. Pertinent Review of Systems:  Constitutional: The patient feels tired at times, especially when she has insomnia or spends the night tossing and turning. well, is healthy, and has no significant complaints. Body temperature has been colder since her gallbladder attack. She has not been having any hot flashes recently. Eyes: Vision is good. There are no significant eye complaints. Neck: The patient has no complaints of anterior neck swelling, soreness, tenderness,  pressure, discomfort, or difficulty swallowing.  Heart: Heart rate increases with exercise or other physical activity. The patient has no complaints of palpitations, irregular heat beats, chest pain, or chest pressure. Gastrointestinal: She has epigastric pain last week (? related to cholecystitis). She still takes one milk of magnesia tablet each evening to treat her constipation. Bowel movents seem normal when using MOM. The patient has no complaints of excessive hunger, acid reflux, upset stomach, diarrhea, or constipation. Legs: Muscle mass and strength seem normal. There are no complaints of numbness, tingling, burning, or pain. No edema is noted. Feet: There are no obvious foot problems. There are no complaints of numbness, tingling, burning, or pain. No edema is noted. Emotional: Notes that she is "still a little anxious and wound up at times".   PAST MEDICAL, FAMILY, AND SOCIAL HISTORY:  Past Medical History  Diagnosis Date  . Thyroid disease     Hypothyroid  . Elevated cholesterol   . Fibroid   . Vulvodynia   . Cholecystitis chronic, acute   . Ovarian cyst, right   . GERD (gastroesophageal  reflux disease)   . Constipation     Family History  Problem Relation Age of Onset  . Thyroid disease Mother   . Thyroid disease Maternal Grandmother     Current outpatient prescriptions:lansoprazole (PREVACID) 30 MG capsule, Take 30 mg by mouth daily.  , Disp: , Rfl: ;  levothyroxine (SYNTHROID, LEVOTHROID) 50 MCG tablet, Take 50 mcg by mouth daily. Brand name Synthroid Only , Disp: , Rfl: ;  ALPRAZolam (XANAX) 0.5 MG tablet, Take 0.5 mg by mouth as needed. Take 1/2 pill prn , Disp: , Rfl:  ALPRAZolam (XANAX) 0.5 MG tablet, Take one tablet twice a day as needed for sleep or anxiety, Disp: 30 tablet, Rfl: 0;  benzonatate (TESSALON PERLES) 100 MG capsule, Take 1 capsule (100 mg total) by mouth every 6 (six) hours as needed for cough., Disp: 30 capsule, Rfl: 0;  Calcium Carbonate-Vitamin D (CALCIUM + D PO), Take by mouth 2 (two) times daily.  , Disp: , Rfl:  clonazePAM (KLONOPIN) 1 MG tablet, Take 1 tablet (1 mg total) by mouth at bedtime as needed for anxiety., Disp: 30 tablet, Rfl: 0;  fish oil-omega-3 fatty acids 1000 MG capsule, Take 2 g by mouth daily.  , Disp: ,  Rfl: ;  Magnesium Hydroxide (MILK OF MAGNESIA PO), Take 2 tablets by mouth at bedtime.  , Disp: , Rfl: ;  Multiple Vitamin (MULTIVITAMIN) tablet, Take 1 tablet by mouth daily.  , Disp: , Rfl:  omega-3 acid ethyl esters (LOVAZA) 1 G capsule, Take 2 g by mouth daily.  , Disp: , Rfl: ;  Probiotic Product (PROBIOTIC PO), Take 1 tablet by mouth daily.  , Disp: , Rfl: ;  Rosuvastatin Calcium (CRESTOR PO), Take by mouth daily.  , Disp: , Rfl:   Allergies as of 05/26/2011 - Review Complete 05/26/2011  Allergen Reaction Noted  . Biaxin  03/12/2011  . Latex  05/26/2011  . Penicillins  03/12/2011  . Zetia (ezetimibe)  03/12/2011    1. Work and Family: She continues to spend quite a bit of time taking care of her daughter [name]. 2. Activities: Wife, mother, and homemaker 3. Smoking, alcohol, or drugs:  No tobacco or illicit drugs. 4.  Primary Care Provider: Gaspar Garbe, MD, MD  ROS: There are no other significant problems involving [name]'s other body systems.   Objective:  Vital Signs:  BP 96/53  Pulse 109  Wt 142 lb 9.6 oz (64.683 kg)   Ht Readings from Last 3 Encounters:  10/20/11 5\' 1"  (1.549 m)  09/25/11 5\' 1"  (1.549 m)  07/16/11 5' 1.5" (1.562 m)   Wt Readings from Last 3 Encounters:  10/20/11 146 lb (66.225 kg)  09/25/11 145 lb (65.772 kg)  07/16/11 143 lb (64.864 kg)   HC Readings from Last 3 Encounters:  No data found for HC   There is no height on file to calculate BSA.  Normalized stature-for-age data available only for age 66 to 20 years. Normalized weight-for-age data available only for age 66 to 20 years.   PHYSICAL EXAM:  Constitutional: The patient  looks really good today. She looks about 5-10 years younger.  Face: The face appears normal.  no evidence of hirsutism.  Eyes: There is no obvious arcus or proptosis. Moisture appears normal. Mouth: The oropharynx and tongue appear normal. Dentition appears to be normal for age. Oral moisture is normal. Neck: The neck appears to be visibly normal. No carotid bruits are noted. The thyroid gland is 20-25  grams in size. The consistency of the thyroid gland is normal. The thyroid gland is not tender to palpation. Lungs: The lungs are clear to auscultation. Air movement is good. Heart: Heart rate and rhythm are regular. Heart sounds S1 and S2 are normal. I did not appreciate any pathologic cardiac murmurs. Abdomen: The abdomen appears to be normal in size. Bowel sounds are normal. There is no obvious hepatomegaly, splenomegaly, or other mass effect.  Arms: Muscle size and bulk are normal for age. Hands: There is trace tremor of her outstretched hands. Phalangeal and metacarpophalangeal joints are normal. Palmar muscles are normal. Palmar skin is normal. Palmar moisture is also normal. Legs: Muscles appear normal for age. No edema is  present. Neurologic: Strength is normal for age in both the upper and lower extremities. Muscle tone is normal. Sensation to touch is normal in both the legs and feet.    LAB DATA: /30/12: Cholesterol was 190, triglycerides 48, HDL 61, and LDL 119. Her TSH was 0.841. Her free T4 was 1.52. Her free T3 was 3.0.   Assessment and Plan:   ASSESSMENT:  1. hypothyroidism: Patient appears to be relatively hyperthyroid on her current dose of Synthroid. The most likely reason for her relative hyperthyroidism is that  as she has lost weight, her Synthroid requirement appears to be decreasing. It is also possible, however, that she is insidiously developing either Graves' disease or one or more toxic nodules. We'll need to follow this issue over time. 2. Thyroiditis: Her thyroiditis is clinically quiescent. 3. Goiter: Thyroid gland is somewhat smaller today. The waxing and waning of the thyroid gland size is consistent with evolving Hashimoto's disease. 4. Fatigue: Her team to be partly due to relative hyperthyroidism, causing her not sleep well. 5. Dyslipidemia: Her HDL is better since she started exercising. Her LDL is still elevated.   PLAN:  1. Diagnostic: Repeat thyroid tests in 2 and 6 months. 2. Therapeutic: Reduce Synthroid to 25 mcg 6 days per week wand one 50 mcg pill on the seventh day. 3. Patient education:  4. Follow-up: Return in about 6 months (around 11/26/2011).  Level of Service: This visit lasted in excess of 40 minutes. More than 50% of the visit was devoted to counseling.    David Stall, MD 11/22/2011 5:30 PM

## 2011-11-24 ENCOUNTER — Ambulatory Visit: Payer: BC Managed Care – PPO | Admitting: Internal Medicine

## 2011-12-08 ENCOUNTER — Ambulatory Visit: Payer: BC Managed Care – PPO | Admitting: "Endocrinology

## 2011-12-10 ENCOUNTER — Ambulatory Visit: Payer: Self-pay | Admitting: "Endocrinology

## 2011-12-10 ENCOUNTER — Other Ambulatory Visit: Payer: Self-pay | Admitting: "Endocrinology

## 2011-12-11 LAB — LIPID PANEL
Cholesterol: 185 mg/dL (ref 0–200)
HDL: 55 mg/dL (ref >39)
LDL Cholesterol: 118 mg/dL — ABNORMAL HIGH (ref 0–99)
Total CHOL/HDL Ratio: 3.4 Ratio
Triglycerides: 60 mg/dL (ref <150)
VLDL: 12 mg/dL (ref 0–40)

## 2011-12-22 ENCOUNTER — Ambulatory Visit (INDEPENDENT_AMBULATORY_CARE_PROVIDER_SITE_OTHER): Payer: BC Managed Care – PPO | Admitting: Internal Medicine

## 2011-12-22 ENCOUNTER — Encounter: Payer: Self-pay | Admitting: Internal Medicine

## 2011-12-22 VITALS — BP 100/60 | HR 76 | Temp 98.4°F | Resp 10 | Ht 61.0 in | Wt 148.0 lb

## 2011-12-22 DIAGNOSIS — E079 Disorder of thyroid, unspecified: Secondary | ICD-10-CM

## 2011-12-22 DIAGNOSIS — Z0000 Encounter for general adult medical examination without abnormal findings: Secondary | ICD-10-CM

## 2011-12-22 DIAGNOSIS — N949 Unspecified condition associated with female genital organs and menstrual cycle: Secondary | ICD-10-CM

## 2011-12-22 DIAGNOSIS — E785 Hyperlipidemia, unspecified: Secondary | ICD-10-CM

## 2011-12-22 DIAGNOSIS — E7800 Pure hypercholesterolemia, unspecified: Secondary | ICD-10-CM

## 2011-12-22 DIAGNOSIS — R102 Pelvic and perineal pain unspecified side: Secondary | ICD-10-CM

## 2011-12-22 DIAGNOSIS — F419 Anxiety disorder, unspecified: Secondary | ICD-10-CM

## 2011-12-22 DIAGNOSIS — G8929 Other chronic pain: Secondary | ICD-10-CM

## 2011-12-22 DIAGNOSIS — N83201 Unspecified ovarian cyst, right side: Secondary | ICD-10-CM

## 2011-12-22 DIAGNOSIS — F411 Generalized anxiety disorder: Secondary | ICD-10-CM

## 2011-12-22 DIAGNOSIS — N83209 Unspecified ovarian cyst, unspecified side: Secondary | ICD-10-CM

## 2011-12-22 DIAGNOSIS — E038 Other specified hypothyroidism: Secondary | ICD-10-CM

## 2011-12-22 LAB — POCT URINALYSIS DIPSTICK
Bilirubin, UA: NEGATIVE
Glucose, UA: NEGATIVE
Ketones, UA: NEGATIVE
Nitrite, UA: NEGATIVE
pH, UA: 7.5

## 2011-12-22 MED ORDER — ALPRAZOLAM 0.5 MG PO TABS: ORAL_TABLET | ORAL | Status: DC

## 2011-12-23 ENCOUNTER — Encounter: Payer: Self-pay | Admitting: Internal Medicine

## 2011-12-23 DIAGNOSIS — G8929 Other chronic pain: Secondary | ICD-10-CM | POA: Insufficient documentation

## 2011-12-23 NOTE — Progress Notes (Signed)
Subjective:    Patient ID: [name], female    DOB: January 06, 1966, 46 y.o.   MRN: [id number]  HPI  [name] is here for a comprehensive evaluation.  She reports she is under considerable stress as her daughter has an immune deficiency and  Is being treated in [place].  [name] is recovering from disc surgery.  She states the Xanax helps her anxiety and she is using only when needed.  [name] also mentions she has had chronic R sided lower pelvic pain near R inguinal area and RLQ of pelvis.  States it comes intermittantly, sometimes will notice at night.  She has had evaluation by Dr. Oletha Blend and she is aware she has an ovarian cyst on the R ovary.  I note that MRI pelvis done 09/09/2011 shows 2.8 R ovarian cyst that appeared to be consistent with a corpus luteum cyst and L renal cyst.  I see in the notes that Dr. Oletha Blend recommended an opinion with Dr. Lossie Faes regarding surgical removal.  [name] denies N.V,D, no dysuria or vaginal discharge pain sometimes achy or sharp.  Last episode a few weeks ago , no pain now.  She also has know multiple GB polyps found on abd U/S  08/19/2011.  Pt reports she has been told that pain may possibly be related to GB.  She is UTD with mammogram done 04/2011  and she has had a Tdap in 04/2011.  Influenza vaccine done in 07/2011  Colonoscopy done by Dr. Madilyn Fireman.  She reports she has no FH of cancer.    She did see Dr. Molli Knock regarding her thyroid nodules.  See note of 05/26/2011.  Allergies  Allergen Reactions  . Biaxin Diarrhea    Chest pain  . Codeine Diarrhea    Chest pain  . Doxycycline Diarrhea    Chest pain  . Levaquin     diarrhea  . Penicillins Diarrhea    Chest pain  . Zetia (Ezetimibe) Other (See Comments)    Abdominal pain  . Tape Rash    Sunburn like rash   Past Medical History  Diagnosis Date  . Thyroid disease     Hypothyroid  . Elevated cholesterol   . Fibroid   . Vulvodynia   . Cholecystitis chronic, acute   . Ovarian cyst,  right   . GERD (gastroesophageal reflux disease)   . Constipation   . Hypothyroidism, acquired, autoimmune   . Thyroiditis, autoimmune   . Fatigue   . Vitamin D deficiency disease   . Dyspepsia   . Goiter, nontoxic, multinodular    Past Surgical History  Procedure Date  . Abdominal hysterectomy     TAH  . Temporomandibular joint surgery   . Cesarean section   . Cesarean section   . Tmj arthroplasty    History   Social History  . Marital Status: Married    Spouse Name: N/A    Number of Children: N/A  . Years of Education: N/A   Occupational History  . Not on file.   Social History Main Topics  . Smoking status: Never Smoker   . Smokeless tobacco: Never Used  . Alcohol Use: 0.6 oz/week    1 Glasses of wine per week     occasional  . Drug Use: No  . Sexually Active: Yes    Birth Control/ Protection: Surgical   Other Topics Concern  . Not on file   Social History Narrative  . No narrative on file   Family History  Problem Relation Age of Onset  . Thyroid disease Mother   . Thyroid disease Maternal Grandmother   . Thyroid disease Sister   . Diabetes Paternal Grandfather   . Cancer Neg Hx    Patient Active Problem List  Diagnoses  . Other specified acquired hypothyroidism  . Pure hypercholesterolemia  . Unspecified vitamin D deficiency  . Thyroid disease  . Elevated cholesterol  . Fibroid  . Vulvodynia  . Acute cholecystitis  . Ovarian cyst, right  . Constipation  . Anxiety  . Hypothyroidism, acquired, autoimmune  . Thyroiditis, autoimmune  . Goiter  . Fatigue  . Vitamin D deficiency disease  . Dyspepsia  . Goiter, nontoxic, multinodular   Current Outpatient Prescriptions on File Prior to Visit  Medication Sig Dispense Refill  . Calcium Carbonate-Vitamin D (CALCIUM + D PO) Take by mouth 2 (two) times daily.        . fish oil-omega-3 fatty acids 1000 MG capsule Take 2 g by mouth daily.        . lansoprazole (PREVACID) 30 MG capsule Take 30 mg by  mouth daily.        Marland Kitchen levothyroxine (SYNTHROID, LEVOTHROID) 50 MCG tablet Take 50 mcg by mouth daily. Brand name Synthroid Only       . Multiple Vitamin (MULTIVITAMIN) tablet Take 1 tablet by mouth daily.        . Probiotic Product (PROBIOTIC PO) Take 1 tablet by mouth daily.              Review of Systems  Constitutional: Negative for fever and appetite change.  HENT: Negative for hearing loss, ear pain and congestion.   Eyes: Negative for pain and redness.  Respiratory: Negative for cough, chest tightness and shortness of breath.   Cardiovascular: Negative for chest pain and leg swelling.  Gastrointestinal: Positive for abdominal pain. Negative for blood in stool.  Genitourinary: Negative for dysuria and difficulty urinating.  Musculoskeletal: Negative for joint swelling.  Skin: Negative for rash.  Psychiatric/Behavioral: Positive for sleep disturbance. The patient is nervous/anxious.        Objective:   Physical Exam  Physical Exam  Nursing note and vitals reviewed.  Constitutional: She is oriented to person, place, and time. She appears well-developed and well-nourished.  HENT:  Head: Normocephalic and atraumatic.  Right Ear: Tympanic membrane and ear canal normal. No drainage. Tympanic membrane is not injected and not erythematous.  Left Ear: Tympanic membrane and ear canal normal. No drainage. Tympanic membrane is not injected and not erythematous.  Nose: Nose normal. Right sinus exhibits no maxillary sinus tenderness and no frontal sinus tenderness. Left sinus exhibits no maxillary sinus tenderness and no frontal sinus tenderness.  Mouth/Throat: Oropharynx is clear and moist. No oral lesions. No oropharyngeal exudate.  Eyes: Conjunctivae and EOM are normal. Pupils are equal, round, and reactive to light.  Neck: Normal range of motion. Neck supple. No JVD present. Carotid bruit is not present. No mass and no thyromegaly present.  Cardiovascular: Normal rate, regular  rhythm, S1 normal, S2 normal and intact distal pulses. Exam reveals no gallop and no friction rub.  No murmur heard.  Pulses:  Carotid pulses are 2+ on the right side, and 2+ on the left side.  Dorsalis pedis pulses are 2+ on the right side, and 2+ on the left side.  No carotid bruit. No LE edema  Pulmonary/Chest: Breath sounds normal. She has no wheezes. She has no rales. She exhibits no tenderness. Breast  No discrete masses  no nipple discharge no axillary adenopathy bilaterally Abdominal: Soft. Bowel sounds are normal. She exhibits no distension and no mass. There is no hepatosplenomegaly. There is no tenderness.  Specifically no tenderness in RLQ or inguinal area.  No rebound  There is no CVA tenderness.   Rectal no masses guaiac neg. Musculoskeletal: Normal range of motion.  No active synovitis to joints.  Lymphadenopathy:  She has no cervical adenopathy.  She has no axillary adenopathy.  Right: No inguinal and no supraclavicular adenopathy present.  Left: No inguinal and no supraclavicular adenopathy present.  Neurological: She is alert and oriented to person, place, and time. She has normal strength and normal reflexes. She displays no tremor. No cranial nerve deficit or sensory deficit. Coordination and gait normal.  Skin: Skin is warm and dry. No rash noted. No cyanosis. Nails show no clubbing.  Psychiatric: She has a normal mood and affect. Her speech is normal and behavior is normal. Cognition and memory are normal.       Assessment & Plan:  1)  Health Maintenance  See scanned sheet.  She is UTD on vaccines and screening tests.  Dash diet given.  Infor for shingles vaccine given - she wishes to check with insurance .  EKG shows sinus bradycardia with poor R wave progression.   2)  R sided pelvic/inguinal pain  Etiology unclear,  ??? Irritable bowel with spasm.  I do not think ovarian cyst causing pain   ??GB dyskinesia but location uncommon.  Diet related.  If pain recurs, pt to see  me in office  She voices understanding 3)  Mild Hyperlipidemia   Follow Dash diet for now 4)   Hypothyroidism with minimal thyroid nodules  Endocrinologist following 5)  Anxiety OK to continue Xanax 6)  R ovarian cyst 7)  GB polyps  No stones evident of U/S

## 2011-12-29 ENCOUNTER — Encounter: Payer: Self-pay | Admitting: "Endocrinology

## 2012-02-04 ENCOUNTER — Other Ambulatory Visit: Payer: Self-pay | Admitting: *Deleted

## 2012-02-04 DIAGNOSIS — E038 Other specified hypothyroidism: Secondary | ICD-10-CM

## 2012-02-13 LAB — CORTISOL: Cortisol, Plasma: 22.4 ug/dL

## 2012-02-13 LAB — T3, FREE: T3, Free: 3 pg/mL (ref 2.3–4.2)

## 2012-02-13 LAB — T4, FREE: Free T4: 1.37 ng/dL (ref 0.80–1.80)

## 2012-04-05 ENCOUNTER — Ambulatory Visit: Payer: BC Managed Care – PPO | Admitting: "Endocrinology

## 2012-04-08 ENCOUNTER — Encounter: Payer: Self-pay | Admitting: "Endocrinology

## 2012-04-08 ENCOUNTER — Ambulatory Visit (INDEPENDENT_AMBULATORY_CARE_PROVIDER_SITE_OTHER): Payer: BC Managed Care – PPO | Admitting: "Endocrinology

## 2012-04-08 VITALS — BP 104/51 | HR 74 | Wt 144.2 lb

## 2012-04-08 DIAGNOSIS — E785 Hyperlipidemia, unspecified: Secondary | ICD-10-CM

## 2012-04-08 DIAGNOSIS — E038 Other specified hypothyroidism: Secondary | ICD-10-CM

## 2012-04-08 DIAGNOSIS — R5383 Other fatigue: Secondary | ICD-10-CM

## 2012-04-08 DIAGNOSIS — E559 Vitamin D deficiency, unspecified: Secondary | ICD-10-CM

## 2012-04-08 DIAGNOSIS — E063 Autoimmune thyroiditis: Secondary | ICD-10-CM

## 2012-04-08 DIAGNOSIS — E049 Nontoxic goiter, unspecified: Secondary | ICD-10-CM

## 2012-04-08 MED ORDER — OMEGA-3-ACID ETHYL ESTERS 1 G PO CAPS: 2.0000 g | ORAL_CAPSULE | Freq: Two times a day (BID) | ORAL | Status: DC

## 2012-04-08 NOTE — Patient Instructions (Addendum)
Follow-up visit and labs in 6 months. Start Lovaza, 2 capsules, twice daily.

## 2012-04-08 NOTE — Progress Notes (Signed)
Subjective:  Patient Name: [name] Date of Birth: 12-06-1965  MRN: [id number]  [name]  presents to the office today for follow-up of her hypothyroidism, thyroiditis, vitamin D deficiency, GERD, dyspepsia, goiter, and fatigue.   HISTORY OF PRESENT ILLNESS:   [name] is a 46 y.o. Caucasian woman.   [name] was accompanied by her daughter.  1. The patient was first referred to me on 03/24/05 by her primary care physician, Dr. Allena Napoleon, for evaluation of a painful goiter and abnormal thyroid blood tests, possibly consistent with hyperthyroidism.   A. She was first recognized to have a goiter about 12 years previously when she was pregnant. Over time the goiter has waxed and waned in size, but gradually had become larger. At times when the thyroid gland was larger and more swollen, even mild direct pressure was very uncomfortable. She was diagnosed with hypothyroidism about 10-11 years previously. She was placed on Cytomel, but that caused her to be a "freaky lunatic". She was subsequently converted to Armour Thyroid. Patient also had one lab tests which show that she might have a low cortisol level. Therefore she had been started on Cortef, 10 mg per day.  B. At our first visit "she described herself as being "exhausted". Her energy was low. She was not sleeping very well. She was restless and frequently awakened during the night. She stated she snored like a truck driver. She had frequent night sweats. She occasionally noted that her heart rate was very fast and racing at times that such fast heart rate would not be expected. She was having a lot of reflux and dyspepsia symptoms that were being treated with Nexium. She was nervous and tremulous a lot. She was on an emotional roller coaster. She had frequent problems with concentrating and remembering. She had a mild problem with hirsutism of her chin and upper lip. She also was on Lexapro for vulvovaginodynia. She has allergies to penicillin,  Biaxin, doxycycline, and many adhesives. Her ethnicity was Ashken[place] Jewish. Family history was positive for hypothyroidism in her mother, maternal grandmother and sister. Her paternal grandfather had diabetes. On physical examination, she looked tired. She had a 25-30 g gland. The thyroid gland was soft. Her thyroid gland was uncomfortable to palpation in both lobes. She showed no evidence of hyperpigmentation of the buccal mucosa, tongue, or skin. She had trace tremor of her hands.  C. Review of laboratory data dating back to 07/08/2004 showed that she had been persistently hyperthyroid from the point of view of her TSH. On 07/13/04 her TSH was 0.151, her free T4 was 1.01, her free T3 was 2.7. On 09/21/04 her TSH was 0.012. Her free T4 was 1.25. On 03/18/05 the TSH was 0.212, free T4 was 0.88, and free T3 was 2.7. Her TPO antibody was mildly elevated at 40.4. The pattern of abnormal, shifting TFTs was c/w flare-ups of Hashimoto's disease. However, since Armour Thyroid was dosed by iodine content rather than T4/T3 ratio, Armour thyroid could also contribute to unstable TFTs. I therefore discontinued her Armour Thyroid and began treatment with Synthroid, initially at 112 mcg/day, but rapidly tapering to 88 mcg/day..  D. To rule out insufficiency of the Hypothalamic-pituitary-adrenal axis, an ACTH stimulation test was performed on 03/28/05. At time 0, her ACTH was 12 and her cortisol was 13.1. At time +30 minutes her cortisol increased to 24.5. At time +60 minutes her cortisol increased even further to 29.1. This was a perfectly normal test of the HPA axis. I therefore discontinued  her cortisol treatment.  2. During the past six years, we've followed her TFTs about every 3-6 months, and we've tapered her Synthroid dose as needed. Her most recent Synthroid dose regimen is 50 mcg one day per week and 25 mcg six days per week. We've found it difficult to treat her hypercholesterolemia because she's had adverse reactions  to all of the statins we've tried and to Zetia as well. Unfortunately, she had an attack of apparent cholecystitis last July. She was evaluated by general surgery, but the gall bladder tests were normal. She has not had a recurrence of pain.   3. The patient's last [facility] visit was on 05/26/11. In the interim the patient has spent a great deal of time at [facility] and at [place] helping to evaluate and manage her daughter's serious problems. She is eating healthier and working out several times per week. She is not taking any dairy products at all now. Her current Synthroid dose is 25 mcg six days per week and 50 mcg on the seventh day. She is taking calcium somewhat episodically, but doesn't know if the pill contains vitamin D. She is still taking Prevacid daily.  4. Pertinent Review of Systems:  Constitutional: The patient feels great in terms of energy. Mentally and psychologically, however, she often fees very overwhelmed. She is "sometimes a little on the cold side." She has not been having any hot flashes recently. Eyes: Vision is good. There are no significant eye complaints. Neck: The patient occasionally feels some anterior neck pressure sensations, but no swelling, soreness, tenderness,  discomfort, or difficulty swallowing.  Heart: Heart rate increases with exercise or other physical activity. The patient has no complaints of palpitations, irregular heat beats, chest pain, or chest pressure. Gastrointestinal: She is no longer having epigastric pains. She still takes two milk of magnesia tablet each evening to treat her constipation. Bowel movents seem normal when using MOM. The patient has no complaints of excessive hunger, acid reflux, upset stomach, or diarrhea. Legs: Muscle mass and strength seem normal. There are no complaints of numbness, tingling, burning, or pain. No edema is noted. Feet: There are no obvious foot problems. There are no complaints of numbness, tingling, burning, or pain. No  edema is noted. Emotional: Notes that she is "still a little anxious and wound up at times".   PAST MEDICAL, FAMILY, AND SOCIAL HISTORY:  Past Medical History  Diagnosis Date  . Thyroid disease     Hypothyroid  . Elevated cholesterol   . Fibroid   . Vulvodynia   . Cholecystitis chronic, acute   . Ovarian cyst, right   . GERD (gastroesophageal reflux disease)   . Constipation   . Hypothyroidism, acquired, autoimmune   . Thyroiditis, autoimmune   . Fatigue   . Vitamin D deficiency disease   . Dyspepsia   . Goiter, nontoxic, multinodular     Family History  Problem Relation Age of Onset  . Thyroid disease Mother   . Thyroid disease Maternal Grandmother   . Thyroid disease Sister   . Diabetes Paternal Grandfather   . Cancer Neg Hx     Current outpatient prescriptions:Calcium Carbonate-Vitamin D (CALCIUM + D PO), Take by mouth 2 (two) times daily.  , Disp: , Rfl: ;  fish oil-omega-3 fatty acids 1000 MG capsule, Take 2 g by mouth daily.  , Disp: , Rfl: ;  lansoprazole (PREVACID) 30 MG capsule, Take 30 mg by mouth daily.  , Disp: , Rfl: ;  levothyroxine (SYNTHROID, LEVOTHROID)  50 MCG tablet, Take 50 mcg by mouth daily. Brand name Synthroid Only , Disp: , Rfl:  Probiotic Product (PROBIOTIC PO), Take 1 tablet by mouth daily.  , Disp: , Rfl: ;  ALPRAZolam (XANAX) 0.5 MG tablet, Take one tablet twice a day as needed for sleep or anxiety, Disp: 30 tablet, Rfl: 1;  Multiple Vitamin (MULTIVITAMIN) tablet, Take 1 tablet by mouth daily.  , Disp: , Rfl:   Allergies as of 04/08/2012 - Review Complete 04/08/2012  Allergen Reaction Noted  . Clarithromycin Diarrhea 03/12/2011  . Codeine Diarrhea 07/07/2011  . Doxycycline Diarrhea 07/07/2011  . Levofloxacin  10/20/2011  . Penicillins Diarrhea 03/12/2011  . Zetia (ezetimibe) Other (See Comments) 03/12/2011  . Tape Rash 09/25/2011    1. Work and Family: She continues to spend quite a bit of time taking care of her daughter Duwayne Heck. 2.  Activities: Wife, mother, and homemaker 3. Smoking, alcohol, or drugs:  No tobacco or illicit drugs. 4. Primary Care Provider: Levon Hedger, MD, MD  ROS: There are no other significant problems involving [name]'s other body systems.   Objective:  Vital Signs:  BP 104/51  Pulse 74  Wt 144 lb 3.2 oz (65.409 kg)   Ht Readings from Last 3 Encounters:  12/22/11 5\' 1"  (1.549 m)  10/20/11 5\' 1"  (1.549 m)  09/25/11 5\' 1"  (1.549 m)   Wt Readings from Last 3 Encounters:  04/08/12 144 lb 3.2 oz (65.409 kg)  12/22/11 148 lb (67.132 kg)  10/20/11 146 lb (66.225 kg)   PHYSICAL EXAM:  Constitutional: The patient looks tired today and became tear-eyed as she described her daughter's recent problems.  She is otherwise alert and bright.  Face: The face appears normal.  no evidence of hirsutism.  Eyes: There is no obvious arcus or proptosis. Moisture appears normal. Mouth: The oropharynx and tongue appear normal. Dentition appears to be normal for age. Oral moisture is normal. Neck: The neck appears to be visibly normal. No carotid bruits are noted. The thyroid gland is 20+  grams in size. The right lobe is within normal limits for size. The left lobe is mildly enlarged. The consistency of the thyroid gland is normal. The thyroid gland is not tender to palpation. Lungs: The lungs are clear to auscultation. Air movement is good. Heart: Heart rate and rhythm are regular. Heart sounds S1 and S2 are normal. I did not appreciate any pathologic cardiac murmurs. Abdomen: The abdomen appears to be normal in size. Bowel sounds are normal. There is no obvious hepatomegaly, splenomegaly, or other mass effect.  Arms: Muscle size and bulk are normal for age. Hands: There is no tremor of her outstretched hands. Phalangeal and metacarpophalangeal joints are normal. Palmar muscles are normal. Palmar skin is normal. Palmar moisture is also normal. Legs: Muscles appear normal for age. No edema is  present. Neurologic: Strength is normal for age in both the upper and lower extremities. Muscle tone is normal. Sensation to touch is normal in both legs.    LAB DATA: 02/04/12: TSH 1.893, free T4 1.37, free T3 3.0                    12/10/11: TSH 1.091, free T4  1.31, free T3 2.8,                     cholesterol 185, triglycerides 60, HDL 55, and LDL 118   Assessment and Plan:   ASSESSMENT:  1. Hypothyroidism: Patient was euthyroid in March on  her current doses of Synthroid. 2. Thyroiditis: Her thyroiditis is clinically quiescent. However, the shift of all three TFTs in the same direction upward from January to March indicates a recent flare up of thyroiditis.  3. Goiter: Thyroid gland is somewhat smaller today. The waxing and waning of the thyroid gland size is consistent with evolving Hashimoto's disease. 4. Fatigue: This problem is much improved.  5. Dyslipidemia: Her HDL is better when she exercises. The LDL may have been better on Lovaza. She has since run out of that medication. ing.  6. Vitamin D deficiency: Since patient eliminated dairy products and has not been as consistent with calcium and vitamin D intake as I'd like, we need to re-assess her bone mineral metabolism parameters.    PLAN:  1. Diagnostic: Repeat thyroid tests and lipid panel in 6 months. Vitamin D's, calcium, and PTH today. 2. Therapeutic: Continue Synthroid doses of  25 mcg 6 days per week wand one 50 mcg pill on the seventh day. Add Lovaza, two capsules, twice daily. 3. Patient education: We discussed Hashimoto's disease and vitamin D deficiency/hyperparathyroidism/osteoporosis at great length. 4. Follow-up: 6 months  Level of Service: This visit lasted in excess of 40 minutes. More than 50% of the visit was devoted to counseling.  David Stall, MD 04/08/2012 11:01 AM

## 2012-04-13 ENCOUNTER — Other Ambulatory Visit: Payer: Self-pay | Admitting: *Deleted

## 2012-04-13 DIAGNOSIS — E038 Other specified hypothyroidism: Secondary | ICD-10-CM

## 2012-04-13 MED ORDER — LEVOTHYROXINE SODIUM 50 MCG PO TABS: 50.0000 ug | ORAL_TABLET | Freq: Every day | ORAL | Status: DC

## 2012-05-19 ENCOUNTER — Telehealth: Payer: Self-pay | Admitting: "Endocrinology

## 2012-05-19 NOTE — Telephone Encounter (Signed)
Patient called requesting lab results from 04/09/12: Since she had them done at [facility] they were not in our computer system. I found them and returned Ms. Pruitt's call. 2. She has not been taking her MVI very much. She has some calcium citrate/vitamin D tablets available (500 mg calcium with 800 IU vitamin D).  3. Calcium, PTH, and calcitriol are mid-range normal. 25-OH vitamin D is low.  4. I suggested that she take one calcium citrate/vitamin D tablet daily with a meal for the next two months. We can then re-check her vitamin D level. David Stall

## 2012-05-24 ENCOUNTER — Other Ambulatory Visit: Payer: Self-pay | Admitting: *Deleted

## 2012-05-24 DIAGNOSIS — N63 Unspecified lump in unspecified breast: Secondary | ICD-10-CM

## 2012-05-25 ENCOUNTER — Other Ambulatory Visit: Payer: Self-pay | Admitting: Radiology

## 2012-05-25 ENCOUNTER — Other Ambulatory Visit: Payer: Self-pay | Admitting: Obstetrics and Gynecology

## 2012-05-25 ENCOUNTER — Other Ambulatory Visit: Payer: Self-pay

## 2012-05-25 DIAGNOSIS — R928 Other abnormal and inconclusive findings on diagnostic imaging of breast: Secondary | ICD-10-CM

## 2012-05-25 DIAGNOSIS — N63 Unspecified lump in unspecified breast: Secondary | ICD-10-CM

## 2012-05-26 ENCOUNTER — Encounter: Payer: Self-pay | Admitting: Internal Medicine

## 2012-05-26 ENCOUNTER — Encounter: Payer: Self-pay | Admitting: Obstetrics and Gynecology

## 2012-05-26 ENCOUNTER — Telehealth: Payer: Self-pay | Admitting: Internal Medicine

## 2012-05-26 ENCOUNTER — Encounter: Payer: Self-pay | Admitting: "Endocrinology

## 2012-05-26 ENCOUNTER — Telehealth: Payer: Self-pay | Admitting: *Deleted

## 2012-05-26 DIAGNOSIS — D241 Benign neoplasm of right breast: Secondary | ICD-10-CM | POA: Insufficient documentation

## 2012-05-26 NOTE — Telephone Encounter (Signed)
[name],  When you can call Patrcia and let her know that I saw she had a breat biopsy yesterday 7/9.  I just checked labs today and pathology is not back yet.  Let her know I am out of town at conference next week but she can call my cell any time next week.    OK to give her my cell numer

## 2012-05-26 NOTE — Telephone Encounter (Signed)
Copy of path report mailed to pt's home address.

## 2012-07-21 ENCOUNTER — Encounter: Payer: BC Managed Care – PPO | Admitting: Obstetrics and Gynecology

## 2012-07-23 ENCOUNTER — Encounter: Payer: BC Managed Care – PPO | Admitting: Obstetrics and Gynecology

## 2012-07-28 ENCOUNTER — Ambulatory Visit (INDEPENDENT_AMBULATORY_CARE_PROVIDER_SITE_OTHER): Payer: BC Managed Care – PPO | Admitting: Obstetrics and Gynecology

## 2012-07-28 ENCOUNTER — Encounter: Payer: Self-pay | Admitting: Obstetrics and Gynecology

## 2012-07-28 VITALS — BP 110/60 | Ht 61.0 in | Wt 140.0 lb

## 2012-07-28 DIAGNOSIS — Z01419 Encounter for gynecological examination (general) (routine) without abnormal findings: Secondary | ICD-10-CM

## 2012-07-28 NOTE — Progress Notes (Signed)
Patient came to see me today for her annual GYN exam. She has been feeling badly with some upper respiratory symptoms, diarrhea and other flulike symptoms since Saturday. In July she had a suspicious mammogram and had a biopsy which revealed a benign fibroadenoma of the right breast. She is having no menopausal symptoms. She has always had normal Pap smears. Her last Pap smear was 2012. In November, 2006 she underwent total abdominal hysterectomy for symptomatic fibroids. She is having no dyspareunia.  HEENT: Within normal limits.Kennon Portela present. Neck: No masses. Supraclavicular lymph nodes: Not enlarged. Breasts: Examined in both sitting and lying position. Symmetrical without skin changes or masses. Abdomen: Soft no masses guarding or rebound. No hernias. Pelvic: External within normal limits. BUS within normal limits. Vaginal examination shows good estrogen effect, no cystocele enterocele or rectocele. Cervix and uterus absent. Adnexa within normal limits. Rectovaginal confirmatory. Extremities within normal limits.  Assessment: Normal GYN exam. Fibroadenoma of right breast.  Plan: Followup mammogram in 6 months.The new Pap smear guidelines were discussed with the patient. No pap done.

## 2012-07-28 NOTE — Patient Instructions (Signed)
Due  followup mammogram as per radiologist.

## 2012-07-29 LAB — URINALYSIS W MICROSCOPIC + REFLEX CULTURE
Bacteria, UA: NONE SEEN
Bilirubin Urine: NEGATIVE
Ketones, ur: NEGATIVE mg/dL
Nitrite: NEGATIVE
Protein, ur: NEGATIVE mg/dL
Specific Gravity, Urine: 1.019 (ref 1.005–1.030)
Squamous Epithelial / LPF: NONE SEEN
Urobilinogen, UA: 0.2 mg/dL (ref 0.0–1.0)

## 2012-08-09 ENCOUNTER — Telehealth: Payer: Self-pay | Admitting: Internal Medicine

## 2012-08-09 MED ORDER — HYDROCORTISONE ACETATE 30 MG RE SUPP: RECTAL | Status: DC

## 2012-08-09 NOTE — Telephone Encounter (Signed)
[name]   Call pt back and let her know that I sent Proctocort rectal suppository to use daily for 6 fdays  If constipated get colace and take 1 capsule bid.  She will not see any result for 3 days

## 2012-08-09 NOTE — Telephone Encounter (Signed)
Pt states she has had painful external hemorrids since Friday... She has over the counter creams and stool comforters... She states would like to know if there is something else she can use... Please call her at [phone].Marland KitchenMarland Kitchen

## 2012-08-09 NOTE — Telephone Encounter (Signed)
Returned pt call with rx information

## 2012-08-09 NOTE — Telephone Encounter (Signed)
Returned pt call ineffective otc meds for hemmorhoids

## 2012-08-18 ENCOUNTER — Encounter (INDEPENDENT_AMBULATORY_CARE_PROVIDER_SITE_OTHER): Payer: Self-pay | Admitting: General Surgery

## 2012-08-18 ENCOUNTER — Ambulatory Visit (INDEPENDENT_AMBULATORY_CARE_PROVIDER_SITE_OTHER): Payer: BC Managed Care – PPO | Admitting: General Surgery

## 2012-08-18 VITALS — BP 126/72 | HR 76 | Temp 97.5°F | Resp 18 | Ht 62.0 in | Wt 144.2 lb

## 2012-08-18 DIAGNOSIS — K644 Residual hemorrhoidal skin tags: Secondary | ICD-10-CM

## 2012-08-18 NOTE — Progress Notes (Signed)
Subjective:     Patient ID: [name], female   DOB: [birth year], 47 y.o.   MRN: [id number]  HPI This is a 46 year old female who presents with perianal pain. She has a history of some occasional discomfort which she has attributed to hemorrhoids in the past after she delivered her children. She has no prior history of any other issues or any procedures. She had some occasional diarrhea prior to September 20. She awakened on September 20 was some significant perirectal pain as well as a fair amount of swelling. She's tried suppositories and creams. This is gotten somewhat better but is still present. This is softer. She is having bowel movements. This is not draining she has no bleeding from this at all. At this point now it is not to pain but more just some discomfort. She had a colonoscopy several years ago she states appears to be fine. She comes today for evaluation.  Review of Systems     Objective:   Physical Exam t  left lateral resolving thrombosed external hemorrhoid Assessment:     External hemorrhoid     Plan:     We discussed external hemorrhoids. I think at this point I would just continue following this. I told her the indications for surgery would be that it does not resolve or some time and we will plan on doing a hemorrhoidectomy or if she had trouble with hygiene moving forward. I recommended to her to begin some soaks as well as continue her conservative therapy and I will plan on seeing her back next week.

## 2012-08-18 NOTE — Patient Instructions (Signed)

## 2012-08-19 ENCOUNTER — Encounter (INDEPENDENT_AMBULATORY_CARE_PROVIDER_SITE_OTHER): Payer: Self-pay | Admitting: General Surgery

## 2012-08-27 ENCOUNTER — Encounter (INDEPENDENT_AMBULATORY_CARE_PROVIDER_SITE_OTHER): Payer: Self-pay | Admitting: General Surgery

## 2012-08-27 ENCOUNTER — Ambulatory Visit (INDEPENDENT_AMBULATORY_CARE_PROVIDER_SITE_OTHER): Payer: BC Managed Care – PPO | Admitting: General Surgery

## 2012-08-27 VITALS — BP 118/72 | HR 62 | Temp 98.0°F | Resp 18 | Ht 62.0 in | Wt 144.8 lb

## 2012-08-27 DIAGNOSIS — K644 Residual hemorrhoidal skin tags: Secondary | ICD-10-CM

## 2012-08-27 NOTE — Progress Notes (Signed)
Subjective:     Patient ID: [name], female   DOB: 20-Oct-1966, 46 y.o.   MRN: [id number]  HPI This is a 46 year old female who presents with perianal pain. She has a history of some occasional discomfort which she has attributed to hemorrhoids in the past after she delivered her children. She has no prior history of any other issues or any procedures. I saw her about 10 days ago and she had a resolving left lateral external hemorrhoid. She is begun doing well since then and is doing much better today. She has a very occasional mild discomfort. She is otherwise having bowel movements without blood and is doing very well.   Review of Systems     Objective:   Physical Exam resolving left lateral external hemorrhoid   Assessment:     Resolving thrombosed external hemorrhoid    Plan:     This is much better with conservative therapy. I would just continue with that now. We discussed indications for operation including persistent symptoms or mass as well as difficulty with hygiene. If any of these occur she is going to call me back in the next one to 2 months. I discussed also that she is going to need a colonoscopy in a routine basis. I did not do an internal exam today with an endoscope due to the fact that she still has a mild amount of discomfort. She does not appear to have any other issues except for a thrombosed external hemorrhoid right ago.

## 2012-09-14 ENCOUNTER — Other Ambulatory Visit: Payer: Self-pay | Admitting: *Deleted

## 2012-09-14 DIAGNOSIS — E038 Other specified hypothyroidism: Secondary | ICD-10-CM

## 2012-09-15 LAB — TSH: TSH: 1.433 u[IU]/mL (ref 0.350–4.500)

## 2012-09-15 LAB — T4, FREE: Free T4: 1.28 ng/dL (ref 0.80–1.80)

## 2012-09-15 LAB — LIPID PANEL: Cholesterol: 174 mg/dL (ref 0–200)

## 2012-10-13 ENCOUNTER — Encounter: Payer: Self-pay | Admitting: "Endocrinology

## 2012-10-13 ENCOUNTER — Ambulatory Visit (INDEPENDENT_AMBULATORY_CARE_PROVIDER_SITE_OTHER): Payer: BC Managed Care – PPO | Admitting: "Endocrinology

## 2012-10-13 VITALS — BP 114/57 | HR 74 | Wt 146.2 lb

## 2012-10-13 DIAGNOSIS — E038 Other specified hypothyroidism: Secondary | ICD-10-CM

## 2012-10-13 DIAGNOSIS — R5383 Other fatigue: Secondary | ICD-10-CM

## 2012-10-13 DIAGNOSIS — E785 Hyperlipidemia, unspecified: Secondary | ICD-10-CM

## 2012-10-13 DIAGNOSIS — E049 Nontoxic goiter, unspecified: Secondary | ICD-10-CM

## 2012-10-13 DIAGNOSIS — E559 Vitamin D deficiency, unspecified: Secondary | ICD-10-CM

## 2012-10-13 DIAGNOSIS — E063 Autoimmune thyroiditis: Secondary | ICD-10-CM

## 2012-10-13 DIAGNOSIS — E069 Thyroiditis, unspecified: Secondary | ICD-10-CM

## 2012-10-13 DIAGNOSIS — R5381 Other malaise: Secondary | ICD-10-CM

## 2012-10-13 NOTE — Patient Instructions (Signed)
Follow up visit in 6 months. Please continue current medications, but ad a third Lovaza capsule each day. Please take your calcium with vitamin d. Please strongly consider taking a good quality multivitamin per day, such as One-A-Day or Centrum.

## 2012-10-13 NOTE — Progress Notes (Signed)
Subjective:  Patient Name: [name] Date of Birth: [birth year]  MRN: [id number]  Jahnia Hewes  presents to the office today for follow-up of her hypothyroidism, thyroiditis, vitamin D deficiency, GERD, dyspepsia, goiter, and fatigue.   HISTORY OF PRESENT ILLNESS:   Elowen is a 46 y.o. Caucasian woman.   Midori was unaccompanied.  1. The patient was first referred to me on 03/24/05 by her primary care physician, Dr. Allena Napoleon, for evaluation of a painful goiter and abnormal thyroid blood tests, possibly consistent with hyperthyroidism.   A. She was first recognized to have a goiter about 12 years previously when she was pregnant. Over time the goiter has waxed and waned in size, but gradually had become larger. At times when the thyroid gland was larger and more swollen, even mild direct pressure was very uncomfortable. She was diagnosed with hypothyroidism about 10-11 years previously. She was placed on Cytomel, but that caused her to be a "freaky lunatic". She was subsequently converted to Armour Thyroid. Patient also had one lab tests which show that she might have a low cortisol level. Therefore she had been started on Cortef, 10 mg per day.  B. At our first visit "she described herself as being "exhausted". Her energy was low. She was not sleeping very well. She was restless and frequently awakened during the night. She stated she snored like a truck driver. She had frequent night sweats. She occasionally noted that her heart rate was very fast and racing at times that such fast heart rate would not be expected. She was having a lot of reflux and dyspepsia symptoms that were being treated with Nexium. She was nervous and tremulous a lot. She was on an emotional roller coaster. She had frequent problems with concentrating and remembering. She had a mild problem with hirsutism of her chin and upper lip. She also was on Lexapro for vulvovaginodynia. She has allergies to penicillin, Biaxin,  doxycycline, and many adhesives. Her ethnicity was Ashken[place] Jewish. Family history was positive for hypothyroidism in her mother, maternal grandmother and sister. Her paternal grandfather had diabetes. On physical examination, she looked tired. She had a 25-30 g thyroid gland. The thyroid gland was soft. Her thyroid gland was uncomfortable to palpation in both lobes. She showed no evidence of hyperpigmentation of the buccal mucosa, tongue, or skin. She had trace tremor of her hands.  C. Review of laboratory data dating back to 07/08/2004 showed that she had been persistently hyperthyroid from the point of view of her TSH. On 07/13/04 her TSH was 0.151, her free T4 was 1.01, her free T3 was 2.7. On 09/21/04 her TSH was 0.012. Her free T4 was 1.25. On 03/18/05 the TSH was 0.212, free T4 was 0.88, and free T3 was 2.7. Her TPO antibody was mildly elevated at 40.4. The pattern of abnormal, shifting TFTs was c/w flare-ups of Hashimoto's disease. However, since Armour Thyroid was dosed by iodine content rather than T4/T3 ratio, Armour thyroid could also contribute to unstable TFTs. I therefore discontinued her Armour Thyroid and began treatment with Synthroid, initially at 112 mcg/day, but rapidly tapering to 88 mcg/day.  D. To rule out insufficiency of the hypothalamic-pituitary-adrenal axis, an ACTH stimulation test was performed on 03/28/05. At time 0, her ACTH was 12 and her cortisol was 13.1. At time +30 minutes her cortisol increased to 24.5. At time +60 minutes her cortisol increased even further to 29.1. This was a perfectly normal test of the HPA axis. I therefore discontinued her cortisol  treatment.  2. During the past seven years, we've followed her TFTs about every 3-6 months, and we've tapered her Synthroid dose as needed. Her most recent Synthroid dose regimen is 50 mcg one day per week and 25 mcg six days per week. We've found it difficult to treat her hypercholesterolemia because she's had adverse  reactions to all of the statins we've tried and to Zetia as well. Unfortunately, she had an attack of apparent cholecystitis last July. She was evaluated by general surgery, but the gall bladder tests were normal. She has not had a recurrence of pain.   3. The patient's last [facility] visit was on 04/08/12. In the interim she has been pretty healthy. There are no new issues or problems.  She is not doing quite as well at eating right and exercising as she was doing at last visit. She still works out at least three times per week, often five times. She has resumed dairy products, but tries to avoid gluten. Her current Synthroid dose is 25 mcg six days per week and 50 mcg on the seventh day. She almost never takes calcium or vitamin D. She is still taking Prevacid daily. She also takes 2 Lovaza gelcaps daily at breakfast. 4. Pertinent Review of Systems:  Constitutional: The patient feels "pretty good". Her energy level is also "pretty good". She does not feel overwhelmed as much anymore. She is "sometimes a little on the cold side." Her body temperature seems more normal now. Hot flashes have resolved.  Eyes: Vision is good with her glasses. There are no significant eye complaints. Neck: The patient occasionally feels some anterior neck pressure sensations, but no swelling, soreness, tenderness,  discomfort, or difficulty swallowing.  Heart: Heart rate increases with exercise or other physical activity. The patient has no complaints of palpitations, irregular heat beats, chest pain, or chest pressure. Gastrointestinal: She still takes two milk of magnesia tablets each evening to treat her constipation. Bowel movents seem normal when using MOM. The patient has no complaints of excessive hunger, acid reflux, upset stomach, stomach pains, or diarrhea. Legs: Muscle mass and strength seem normal. There are no complaints of numbness, tingling, burning, or pain. No edema is noted. Feet: There are no obvious foot problems.  Her left big toe feels like a stinging pain intermittently for the past several days. There are no complaints of numbness, tingling, burning, or pain. No edema is noted. Emotional: She feels" fine, a little better". "I have my ups and downs. She states that she is "still a little anxious and wound up at times". Mental: Usually does well, but sometimes gets overloaded and forgetful. GYN: She had an abdominal hysterectomy at age 16, but her ovaries remain in place.     PAST MEDICAL, FAMILY, AND SOCIAL HISTORY:  Past Medical History  Diagnosis Date  . Thyroid disease     Hypothyroid  . Elevated cholesterol   . Fibroid   . Vulvodynia   . Cholecystitis chronic, acute   . Ovarian cyst, right   . GERD (gastroesophageal reflux disease)   . Constipation   . Hypothyroidism, acquired, autoimmune   . Thyroiditis, autoimmune   . Fatigue   . Vitamin D deficiency disease   . Dyspepsia   . Goiter, nontoxic, multinodular     Family History  Problem Relation Age of Onset  . Thyroid disease Mother   . Thyroid disease Maternal Grandmother   . Thyroid disease Sister   . Diabetes Paternal Grandfather   . Cancer Neg  Hx     Current outpatient prescriptions:lansoprazole (PREVACID) 30 MG capsule, Take 30 mg by mouth daily.  , Disp: , Rfl: ;  levothyroxine (SYNTHROID, LEVOTHROID) 50 MCG tablet, Take 1 tablet (50 mcg total) by mouth daily. Brand name Synthroid Only, Disp: 90 tablet, Rfl: 3;  omega-3 acid ethyl esters (LOVAZA) 1 G capsule, Take 2 capsules (2 g total) by mouth 2 (two) times daily., Disp: 360 capsule, Rfl: 6 ALPRAZolam (XANAX) 0.5 MG tablet, Take one tablet twice a day as needed for sleep or anxiety, Disp: 30 tablet, Rfl: 1;  Calcium Carbonate-Vitamin D (CALCIUM + D PO), Take by mouth 2 (two) times daily.  , Disp: , Rfl: ;  HYDROCORTISONE ACE, RECTAL, (PROCTOCORT) 30 MG SUPP, Take one suppository per rectum daily for 6 days, Disp: 6 each, Rfl: 0;  Multiple Vitamin (MULTIVITAMIN) tablet, Take 1  tablet by mouth daily.  , Disp: , Rfl:  Probiotic Product (PROBIOTIC PO), Take 1 tablet by mouth daily.  , Disp: , Rfl:   Allergies as of 10/13/2012 - Review Complete 10/13/2012  Allergen Reaction Noted  . Clarithromycin Diarrhea 03/12/2011  . Codeine Diarrhea 07/07/2011  . Doxycycline Diarrhea 07/07/2011  . Levofloxacin  10/20/2011  . Penicillins Diarrhea 03/12/2011  . Zetia (ezetimibe) Other (See Comments) 03/12/2011  . Tape Rash 09/25/2011    1. Work and Family: She continues to spend quite a bit of time taking care of her daughter, Duwayne Heck. 2. Activities: Wife, mother, and homemaker 3. Smoking, alcohol, or drugs:  No tobacco or illicit drugs. 4. Primary Care Provider: Levon Hedger, MD  ROS: There are no other significant problems involving Dalilah's other body systems.   Objective:  Vital Signs:  BP 114/57  Pulse 74  Wt 146 lb 3.2 oz (66.316 kg) She has gained 2 lbs since last visit.   Ht Readings from Last 3 Encounters:  08/27/12 5\' 2"  (1.575 m)  08/18/12 5\' 2"  (1.575 m)  07/28/12 5\' 1"  (1.549 m)   Wt Readings from Last 3 Encounters:  10/13/12 146 lb 3.2 oz (66.316 kg)  08/27/12 144 lb 12.8 oz (65.681 kg)  08/18/12 144 lb 3.2 oz (65.409 kg)   PHYSICAL EXAM:  Constitutional: The patient looks bright and alert today. She really looks good. She is much better mentally, emotionally, and physically.   Face: The face appears normal.  No evidence of hirsutism.  Eyes: There is no obvious arcus or proptosis. Moisture appears normal. Mouth: The oropharynx and tongue appear normal. Dentition appears to be normal for age. Oral moisture is normal. Neck: The neck appears to be visibly normal. No carotid bruits are noted. The thyroid gland is 20-25gn  grams in size. The right lobe is within normal limits for size. The left lobe and isthmus are mildly enlarged. The consistency of the thyroid gland is normal. The thyroid gland is not tender to palpation. Lungs: The lungs are  clear to auscultation. Air movement is good. Heart: Heart rate and rhythm are regular. Heart sounds S1 and S2 are normal. I did not appreciate any pathologic cardiac murmurs. Abdomen: The abdomen appears to be normal in size. Bowel sounds are normal. There is no obvious hepatomegaly, splenomegaly, or other mass effect.  Arms: Muscle size and bulk are normal for age. Hands: There is no tremor of her outstretched hands. Phalangeal and metacarpophalangeal joints are normal. Palmar muscles are normal. Palmar skin is normal. Palmar moisture is also normal. Legs: Muscles appear normal for age. No edema is present. Left great toe: At  the medial aspect of the plantar surface of the left great toe is a small linear break in the skin, 2-3 mm in length. It is barely visible even under magnification. There is no redness, tenderness, heat, or swelling.   Neurologic: Strength is normal for age in both the upper and lower extremities. Muscle tone is normal. Sensation to touch is normal in both legs.    LAB DATA:   09/14/12: TSH 1.453, free T4 1.28, free T3 3.2, cholesterol 174, triglycerides 52, HDL 56, LDL 108.  02/04/12: TSH 1.893, free T4 1.37, free T3 3.0                     12/10/11: TSH 1.091, free T4  1.31, free T3 2.8,                    cholesterol 185, triglycerides 60, HDL 55, and LDL 118   Assessment and Plan:   ASSESSMENT:  1. Hypothyroidism: Patient was euthyroid in January, March, and again in October on her current doses of Synthroid. 2. Thyroiditis: Her thyroiditis is clinically quiescent. Her pattern is to have episodic inflammation.   3. Goiter: Thyroid gland is somewhat larger today. The waxing and waning of the thyroid gland size is consistent with evolving Hashimoto's disease. 4. Fatigue: This problem is much improved.  5. Dyslipidemia: Her HDL is better when she exercises. The LDL is better since starting Lovaza. She may get better effect from taking a third Lovaza capsule per day.     6. Vitamin D deficiency: She was supposed to have calcium, PTH, and vitamins D drawn, but for some reason the lab cancelled the order. We need to draw those studies now.   PLAN:  1. Diagnostic:Draw calcium, PTH, and both vitamin d tests today.  Repeat thyroid tests, lipid panel, calcium, PTH, and both vitamin D tests in 6 months. Vitamin D's, calcium, and PTH today. 2. Therapeutic: Continue Synthroid doses of  25 mcg 6 days per week wand one 50 mcg pill on the seventh day. Add third Lovaza capsule at lunch or dinner.  3. Patient education: We discussed Hashimoto's disease and vitamin D deficiency/hyperparathyroidism/osteoporosis at great length. 4. Follow-up: 6 months  Level of Service: This visit lasted in excess of 40 minutes. More than 50% of the visit was devoted to counseling.  David Stall, MD 10/13/2012 11:03 AM

## 2012-10-14 LAB — VITAMIN D 25 HYDROXY (VIT D DEFICIENCY, FRACTURES): Vit D, 25-Hydroxy: 30 ng/mL (ref 30–89)

## 2012-10-15 ENCOUNTER — Telehealth: Payer: Self-pay | Admitting: "Endocrinology

## 2012-10-15 LAB — PTH, INTACT AND CALCIUM
Calcium, Total (PTH): 9 mg/dL (ref 8.4–10.5)
PTH: 41.5 pg/mL (ref 14.0–72.0)

## 2012-10-15 NOTE — Telephone Encounter (Signed)
I notified the patient that her calcium was in the lower quartile of normal. Her vitamin D level is at the bottom of the normal range. Her PTH, while still normal, has almost doubled in the past year in response to her low intake of calcium and vitamin D. She needs to take at least one good calcium-vitamin D pill, such as Citracal D, once daily at supper. [name]

## 2012-10-17 LAB — VITAMIN D 1,25 DIHYDROXY
Vitamin D 1, 25 (OH)2 Total: 66 pg/mL (ref 18–72)
Vitamin D2 1, 25 (OH)2: <8 pg/mL
Vitamin D3 1, 25 (OH)2: 66 pg/mL

## 2012-12-06 ENCOUNTER — Encounter: Payer: Self-pay | Admitting: Gynecology

## 2012-12-24 ENCOUNTER — Encounter: Payer: Self-pay | Admitting: *Deleted

## 2013-01-01 ENCOUNTER — Other Ambulatory Visit: Payer: Self-pay

## 2013-02-21 ENCOUNTER — Encounter: Payer: Self-pay | Admitting: Women's Health

## 2013-02-21 ENCOUNTER — Ambulatory Visit (INDEPENDENT_AMBULATORY_CARE_PROVIDER_SITE_OTHER): Payer: BC Managed Care – PPO | Admitting: Women's Health

## 2013-02-21 DIAGNOSIS — N760 Acute vaginitis: Secondary | ICD-10-CM

## 2013-02-21 DIAGNOSIS — B9689 Other specified bacterial agents as the cause of diseases classified elsewhere: Secondary | ICD-10-CM

## 2013-02-21 DIAGNOSIS — N949 Unspecified condition associated with female genital organs and menstrual cycle: Secondary | ICD-10-CM

## 2013-02-21 DIAGNOSIS — N898 Other specified noninflammatory disorders of vagina: Secondary | ICD-10-CM

## 2013-02-21 DIAGNOSIS — A499 Bacterial infection, unspecified: Secondary | ICD-10-CM

## 2013-02-21 LAB — WET PREP FOR TRICH, YEAST, CLUE

## 2013-02-21 MED ORDER — METRONIDAZOLE 0.75 % VA GEL: VAGINAL | Status: DC

## 2013-02-21 NOTE — Progress Notes (Signed)
Patient ID: [name], female   DOB: [birth year], 47 y.o.   MRN: [id number] Presents with complaint of vaginal discharge with odor that comes and goes, not daily. Also states has had some vaginal irritation. TAH. Denies urinary symptoms, abdominal pain or fever. Has had increased stress caring for daughter with numerous health problems and developmental delays.  Exam: Appears well, external genitalia erythematous, speculum exam moderate amount of milky adherent discharge was noted wet prep positive for clues, and TNTC bacteria. No odor noted. Bimanual no adnexal fullness or tenderness.  Bacteria vaginosis  Plan: MetroGel vaginal cream 1 applicator at bedtime x5, alcohol precautions reviewed. Instructed to call if no relief of discharge.

## 2013-02-21 NOTE — Patient Instructions (Signed)
Bacterial Vaginosis Bacterial vaginosis (BV) is a vaginal infection where the normal balance of bacteria in the vagina is disrupted. The normal balance is then replaced by an overgrowth of certain bacteria. There are several different kinds of bacteria that can cause BV. BV is the most common vaginal infection in women of childbearing age. CAUSES   The cause of BV is not fully understood. BV develops when there is an increase or imbalance of harmful bacteria.  Some activities or behaviors can upset the normal balance of bacteria in the vagina and put women at increased risk including:  Having a new sex partner or multiple sex partners.  Douching.  Using an intrauterine device (IUD) for contraception.  It is not clear what role sexual activity plays in the development of BV. However, women that have never had sexual intercourse are rarely infected with BV. Women do not get BV from toilet seats, bedding, swimming pools or from touching objects around them.  SYMPTOMS   Grey vaginal discharge.  A fish-like odor with discharge, especially after sexual intercourse.  Itching or burning of the vagina and vulva.  Burning or pain with urination.  Some women have no signs or symptoms at all. DIAGNOSIS  Your caregiver must examine the vagina for signs of BV. Your caregiver will perform lab tests and look at the sample of vaginal fluid through a microscope. They will look for bacteria and abnormal cells (clue cells), a pH test higher than 4.5, and a positive amine test all associated with BV.  RISKS AND COMPLICATIONS   Pelvic inflammatory disease (PID).  Infections following gynecology surgery.  Developing HIV.  Developing herpes virus. TREATMENT  Sometimes BV will clear up without treatment. However, all women with symptoms of BV should be treated to avoid complications, especially if gynecology surgery is planned. Female partners generally do not need to be treated. However, BV may spread  between female sex partners so treatment is helpful in preventing a recurrence of BV.   BV may be treated with antibiotics. The antibiotics come in either pill or vaginal cream forms. Either can be used with nonpregnant or pregnant women, but the recommended dosages differ. These antibiotics are not harmful to the baby.  BV can recur after treatment. If this happens, a second round of antibiotics will often be prescribed.  Treatment is important for pregnant women. If not treated, BV can cause a premature delivery, especially for a pregnant woman who had a premature birth in the past. All pregnant women who have symptoms of BV should be checked and treated.  For chronic reoccurrence of BV, treatment with a type of prescribed gel vaginally twice a week is helpful. HOME CARE INSTRUCTIONS   Finish all medication as directed by your caregiver.  Do not have sex until treatment is completed.  Tell your sexual partner that you have a vaginal infection. They should see their caregiver and be treated if they have problems, such as a mild rash or itching.  Practice safe sex. Use condoms. Only have 1 sex partner. PREVENTION  Basic prevention steps can help reduce the risk of upsetting the natural balance of bacteria in the vagina and developing BV:  Do not have sexual intercourse (be abstinent).  Do not douche.  Use all of the medicine prescribed for treatment of BV, even if the signs and symptoms go away.  Tell your sex partner if you have BV. That way, they can be treated, if needed, to prevent reoccurrence. SEEK MEDICAL CARE IF:     Your symptoms are not improving after 3 days of treatment.  You have increased discharge, pain, or fever. MAKE SURE YOU:   Understand these instructions.  Will watch your condition.  Will get help right away if you are not doing well or get worse. FOR MORE INFORMATION  Division of STD Prevention (DSTDP), Centers for Disease Control and Prevention:  www.cdc.gov/std American [employer] Association (ASHA): www.ashastd.org  Document Released: 11/03/2005 Document Revised: 01/26/2012 Document Reviewed: 04/26/2009 [employer] Patient Information 2013 [employer].  

## 2013-03-30 ENCOUNTER — Other Ambulatory Visit: Payer: Self-pay | Admitting: *Deleted

## 2013-03-30 DIAGNOSIS — E038 Other specified hypothyroidism: Secondary | ICD-10-CM

## 2013-04-08 LAB — LIPID PANEL
Cholesterol: 194 mg/dL (ref 0–200)
HDL: 50 mg/dL (ref >39)
Total CHOL/HDL Ratio: 3.9 Ratio
Triglycerides: 61 mg/dL (ref <150)

## 2013-04-08 LAB — TSH: TSH: 1.648 u[IU]/mL (ref 0.350–4.500)

## 2013-04-08 LAB — T3, FREE: T3, Free: 3.4 pg/mL (ref 2.3–4.2)

## 2013-04-08 LAB — T4, FREE: Free T4: 1.46 ng/dL (ref 0.80–1.80)

## 2013-04-12 LAB — PTH, INTACT AND CALCIUM: Calcium, Total (PTH): 9.3 mg/dL (ref 8.4–10.5)

## 2013-04-14 LAB — VITAMIN D 1,25 DIHYDROXY
Vitamin D2 1, 25 (OH)2: <8 pg/mL
Vitamin D3 1, 25 (OH)2: 74 pg/mL

## 2013-04-20 ENCOUNTER — Ambulatory Visit (INDEPENDENT_AMBULATORY_CARE_PROVIDER_SITE_OTHER): Payer: BC Managed Care – PPO | Admitting: "Endocrinology

## 2013-04-20 ENCOUNTER — Encounter: Payer: Self-pay | Admitting: "Endocrinology

## 2013-04-20 VITALS — BP 102/58 | HR 70 | Wt 145.3 lb

## 2013-04-20 DIAGNOSIS — E063 Autoimmune thyroiditis: Secondary | ICD-10-CM

## 2013-04-20 DIAGNOSIS — E038 Other specified hypothyroidism: Secondary | ICD-10-CM

## 2013-04-20 DIAGNOSIS — R5381 Other malaise: Secondary | ICD-10-CM

## 2013-04-20 DIAGNOSIS — E7800 Pure hypercholesterolemia, unspecified: Secondary | ICD-10-CM

## 2013-04-20 DIAGNOSIS — R5383 Other fatigue: Secondary | ICD-10-CM

## 2013-04-20 DIAGNOSIS — E049 Nontoxic goiter, unspecified: Secondary | ICD-10-CM

## 2013-04-20 DIAGNOSIS — E559 Vitamin D deficiency, unspecified: Secondary | ICD-10-CM

## 2013-04-20 LAB — COMPREHENSIVE METABOLIC PANEL
Alkaline Phosphatase: 47 U/L (ref 39–117)
BUN: 13 mg/dL (ref 6–23)
CO2: 24 mEq/L (ref 19–32)
Creat: 0.69 mg/dL (ref 0.50–1.10)
Glucose, Bld: 86 mg/dL (ref 70–99)
Total Bilirubin: 0.5 mg/dL (ref 0.3–1.2)
Total Protein: 6.2 g/dL (ref 6.0–8.3)

## 2013-04-20 LAB — CBC WITH DIFFERENTIAL/PLATELET
Basophils Absolute: 0 10*3/uL (ref 0.0–0.1)
Basophils Relative: 1 % (ref 0–1)
HCT: 39.3 % (ref 36.0–46.0)
Hemoglobin: 13.6 g/dL (ref 12.0–15.0)
Lymphocytes Relative: 39 % (ref 12–46)
Lymphs Abs: 2.7 10*3/uL (ref 0.7–4.0)
MCH: 30.7 pg (ref 26.0–34.0)
MCV: 88.7 fL (ref 78.0–100.0)
Monocytes Absolute: 0.4 10*3/uL (ref 0.1–1.0)
Platelets: 319 10*3/uL (ref 150–400)
RBC: 4.43 MIL/uL (ref 3.87–5.11)
WBC: 6.9 10*3/uL (ref 4.0–10.5)

## 2013-04-20 LAB — FERRITIN: Ferritin: 51 ng/mL (ref 10–291)

## 2013-04-20 LAB — IRON: Iron: 112 ug/dL (ref 42–145)

## 2013-04-20 NOTE — Patient Instructions (Signed)
Follow up visit in 6 months. 

## 2013-04-20 NOTE — Progress Notes (Signed)
Subjective:  Patient Name: [name] Date of Birth: 04-07-1966  MRN: [id number]  [name]  presents to the office today for follow-up of her hypothyroidism, thyroiditis, vitamin D deficiency, GERD, dyspepsia, goiter, and fatigue.   HISTORY OF PRESENT ILLNESS:   [name] is a 47 y.o. Caucasian woman.   [name] was unaccompanied.  1. The patient was first referred to me on 03/24/05 by her primary care physician, Dr. Adriana Simas, for evaluation of a painful goiter and abnormal thyroid blood tests, possibly consistent with hyperthyroidism.   A. She was first recognized to have a goiter about 12 years previously when she was pregnant. Over time the goiter has waxed and waned in size, but gradually had become larger. At times when the thyroid gland was larger and more swollen, even mild direct pressure was very uncomfortable. She was diagnosed with hypothyroidism about 10-11 years previously. She was placed on Cytomel, but that caused her to be a "freaky lunatic". She was subsequently converted to Armour Thyroid. Patient also had one lab tests which show that she might have a low cortisol level. Therefore she had been started on Cortef, 10 mg per day.  B. At our first visit "she described herself as being "exhausted". Her energy was low. She was not sleeping very well. She was restless and frequently awakened during the night. She stated she snored like a truck driver. She had frequent night sweats. She occasionally noted that her heart rate was very fast and racing at times that such fast heart rate would not be expected. She was having a lot of reflux and dyspepsia symptoms that were being treated with Nexium. She was nervous and tremulous a lot. She was on an "emotional roller coaster". She had frequent problems with concentrating and remembering. She had a mild problem with hirsutism of her chin and upper lip. She also was on Lexapro for vulvovaginodynia. She had allergies to penicillin, Biaxin,  doxycycline, and many adhesives. Her ethnicity was Ashken[place] Jewish. Family history was positive for hypothyroidism in her mother, maternal grandmother and sister. Her paternal grandfather had diabetes. On physical examination, she looked tired. She had a 25-30 g thyroid gland. The thyroid gland was soft. Her thyroid gland was uncomfortable to palpation in both lobes. She showed no evidence of hyperpigmentation of the buccal mucosa, tongue, or skin. She had trace tremor of her hands.  C. Review of laboratory data dating back to 07/08/2004 showed that she had been persistently hyperthyroid from the point of view of her TSH. On 07/13/04 her TSH was 0.151, her free T4 was 1.01, her free T3 was 2.7. On 09/21/04 her TSH was 0.012. Her free T4 was 1.25. On 03/18/05 the TSH was 0.212, free T4 was 0.88, and free T3 was 2.7. Her TPO antibody was mildly elevated at 40.4. The pattern of abnormal, shifting TFTs was c/w flare-ups of Hashimoto's disease. However, since Armour Thyroid was dosed by iodine content rather than T4/T3 ratio, Armour thyroid could also contribute to unstable TFTs. I therefore discontinued her Armour Thyroid and began treatment with Synthroid, initially at 112 mcg/day, but rapidly tapering to 88 mcg/day.  D. To rule out insufficiency of the hypothalamic-pituitary-adrenal axis, an ACTH stimulation test was performed on 03/28/05. At time 0, her ACTH was 12 and her cortisol was 13.1. At time +30 minutes her cortisol increased to 24.5. At time +60 minutes her cortisol increased even further to 29.1. This was a perfectly normal test of the HPA axis. I therefore discontinued her cortisol  treatment.   2. During the past eight years, we've followed her TFTs about every 3-6 months, and we've adjusted her Synthroid dose as needed. Her most recent Synthroid dose regimen is 50 mcg one day per week and 25 mcg six days per week. We've found it difficult to treat her hypercholesterolemia because she's had adverse  reactions to all of the statins we've tried and to Zetia as well. Unfortunately, she had an attack of apparent cholecystitis last July. She was evaluated by general surgery, but the gall bladder tests were normal. She has not had a recurrence of that pain.    3. The patient's last [facility] visit was on 10/13/12.   A. In the interim she has been healthy, but is very fatigued. She has "trouble getting going in the morning" and often fatigues significantly during the day. She still exercises at least three days per week and tries to be careful with her diet. She does not consume much dairy products and tries to avoid gluten   B. There has been a lot of stress in the family. Her daughter was in the hospital at [facility] recently with severe blood loss for the second time due to excessive periods and a new cellulitis. She will soon have a hysterectomy.The daughter will continue estrogen post-operatively.     C. Her current Synthroid dose is 25 mcg six days per week and 50 mcg on the seventh day. She almost never takes calcium or vitamin D. She is still taking Prevacid daily. She also takes 2 Lovaza gelcaps daily at breakfast.  4. Pertinent Review of Systems:  Constitutional: The patient feels "very tired and worn out". Her energy level is also "very low". She often feels overwhelmed. She is "colder, especially about 5 PM."  Hot flashes have resolved.  Eyes: Vision is good with her glasses. There are no significant eye complaints. Neck: The patient occasionally feels some anterior neck pressure sensations, but no swelling, soreness, tenderness,  discomfort, or difficulty swallowing.  Heart: She occasionally feels a rapid heart rate for less than a minute. Heart rate increases with exercise or other physical activity. The patient has no complaints of palpitations, irregular heat beats, chest pain, or chest pressure. Gastrointestinal: She still takes two milk of magnesia tablets each evening to treat her constipation.  Bowel movents seem normal when using MOM. The patient has no complaints of excessive hunger, acid reflux, upset stomach, stomach pains, or diarrhea. Legs: Muscle mass and strength seem normal. There are no complaints of numbness, tingling, burning, or pain. No edema is noted. Feet: There are no complaints of numbness, tingling, burning, or pain. No edema is noted. Emotional: She feels "I'm stressed and overwhelmed, but not depressed.".  Mental: "My memory is awful. I'm a little foggy at times." . GYN: She had an abdominal hysterectomy at age 41, but her ovaries remain in place.    PAST MEDICAL, FAMILY, AND SOCIAL HISTORY:  Past Medical History  Diagnosis Date  . Thyroid disease     Hypothyroid  . Elevated cholesterol   . Fibroid   . Vulvodynia   . Cholecystitis chronic, acute   . Ovarian cyst, right   . GERD (gastroesophageal reflux disease)   . Constipation   . Hypothyroidism, acquired, autoimmune   . Thyroiditis, autoimmune   . Fatigue   . Vitamin D deficiency disease   . Dyspepsia   . Goiter, nontoxic, multinodular     Family History  Problem Relation Age of Onset  . Thyroid disease  Mother   . Thyroid disease Maternal Grandmother   . Thyroid disease Sister   . Diabetes Paternal Grandfather   . Cancer Neg Hx     Current outpatient prescriptions:lansoprazole (PREVACID) 30 MG capsule, Take 30 mg by mouth daily.  , Disp: , Rfl: ;  levothyroxine (SYNTHROID, LEVOTHROID) 50 MCG tablet, Take 1 tablet (50 mcg total) by mouth daily. Brand name Synthroid Only, Disp: 90 tablet, Rfl: 3;  Probiotic Product (PROBIOTIC PO), Take 1 tablet by mouth daily.  , Disp: , Rfl:  ALPRAZolam (XANAX) 0.5 MG tablet, Take one tablet twice a day as needed for sleep or anxiety, Disp: 30 tablet, Rfl: 1;  Calcium Carbonate-Vitamin D (CALCIUM + D PO), Take by mouth 2 (two) times daily.  , Disp: , Rfl: ;  HYDROCORTISONE ACE, RECTAL, (PROCTOCORT) 30 MG SUPP, Take one suppository per rectum daily for 6 days,  Disp: 6 each, Rfl: 0;  metroNIDAZOLE (METROGEL VAGINAL) 0.75 % vaginal gel, 1 applicator per vagina at HS x 5, Disp: 70 g, Rfl: 0 Multiple Vitamin (MULTIVITAMIN) tablet, Take 1 tablet by mouth daily.  , Disp: , Rfl: ;  omega-3 acid ethyl esters (LOVAZA) 1 G capsule, Take 2 capsules (2 g total) by mouth 2 (two) times daily., Disp: 360 capsule, Rfl: 6  Allergies as of 04/20/2013 - Review Complete 04/20/2013  Allergen Reaction Noted  . Clarithromycin Diarrhea 03/12/2011  . Codeine Diarrhea 07/07/2011  . Doxycycline Diarrhea 07/07/2011  . Levofloxacin  10/20/2011  . Penicillins Diarrhea 03/12/2011  . Zetia (ezetimibe) Other (See Comments) 03/12/2011  . Tape Rash 09/25/2011    1. Work and Family: She continues to spend quite a bit of time taking care of her daughter, Duwayne Heck. 2. Activities: Wife, mother, and homemaker 3. Smoking, alcohol, or drugs:  No tobacco or illicit drugs. 4. Primary Care Provider: Levon Hedger, MD  REVIEW OF SYSTEMS: There are no other significant problems involving Hortense's other body systems.   Objective:  Vital Signs:  BP 102/58  Pulse 70  Wt 145 lb 4.8 oz (65.908 kg)  BMI 26.57 kg/m2 She has lost one lb since last visit.   Ht Readings from Last 3 Encounters:  08/27/12 5\' 2"  (1.575 m)  08/18/12 5\' 2"  (1.575 m)  07/28/12 5\' 1"  (1.549 m)   Wt Readings from Last 3 Encounters:  04/20/13 145 lb 4.8 oz (65.908 kg)  10/13/12 146 lb 3.2 oz (66.316 kg)  08/27/12 144 lb 12.8 oz (65.681 kg)   PHYSICAL EXAM:  Constitutional: The patient is alert and oriented x3. She looks more pale and more tired, but is mentally quite sharp. She smiles and jokes, but is not doing nearly as well as she was at her last visit. Face: The face appears normal.  No evidence of hirsutism.  Eyes: There is no obvious arcus or proptosis. Moisture appears normal. Mouth: The oropharynx and tongue appear normal. Dentition appears to be normal for age. Oral moisture is normal. Neck: The  neck appears to be visibly normal. No carotid bruits are noted. The thyroid gland is 20-25 grams in size. The right lobe is within normal limits for size. The left lobe is mildly enlarged. The consistency of the thyroid gland is normal. The thyroid gland is mildly tender to palpation bilaterally today, with more tenderness on the left. Lungs: The lungs are clear to auscultation. Air movement is good. Heart: Heart rate and rhythm are regular. Heart sounds S1 and S2 are normal. I did not appreciate any pathologic cardiac murmurs. Abdomen: The  abdomen is be normal in size. Bowel sounds are normal. There is no obvious hepatomegaly, splenomegaly, or other mass effect.  Arms: Muscle size and bulk are normal for age. Hands: There is no tremor of her outstretched hands. Phalangeal and metacarpophalangeal joints are normal. Palmar muscles are normal. Palmar skin is normal. Palmar moisture is also normal. She has mild pallor of her fingernails. Legs: Muscles appear normal for age. No edema is present.  Neurologic: Strength is normal for age in both the upper and lower extremities. Muscle tone is normal. Sensation to touch is normal in both legs.   Skin: Her face and body are unusually pale for her.  LAB DATA:   04/08/13: TSH 1.648, free T4 1.46, free T3 3.4. Calcium 93, PTH 51.4, 25-hydroxy vitamin D 27, 1,25-dihydroxy vitamin D 74, cholesterol 174, triglycerides 61, HDL 50, LDL 132  09/14/12: TSH 1.453, free T4 1.28, free T3 3.2, cholesterol 174, triglycerides 52, HDL 56, LDL 108.  02/04/12: TSH 1.893, free T4 1.37, free T3 3.0                     12/10/11: TSH 1.091, free T4  1.31, free T3 2.8, cholesterol 185, triglycerides 60, HDL 55, and LDL 118   Assessment and Plan:   ASSESSMENT:  1. Hypothyroidism: Patient was euthyroid in January, March, and  October of 2013 and again last month on her current doses of Synthroid. 2. Thyroiditis: Her thyroiditis is mildly active today.  In the last 7 months all 3  TFTs have increased in the same direction, c/w a recent flare up of thyroiditis.Her pattern is to have episodic inflammation.   3. Goiter: Thyroid gland is at about the same size today as at last visit. The waxing and waning of the thyroid gland size over time is consistent with evolving Hashimoto's disease. 4. Fatigue: This problem is much worse.She may be iron deficient and /or anemic. The problems with her daughter are certainly causing a great deal of stress. Tanasha may not be sleeping as well as she thinks.   5. Dyslipidemia: Her HDL is lower and her cholesterol and LDL are higher. I suspect that she is not exercising as much as before.  6. Vitamin D deficiency: Her 25-OH vitamin D level is lower, and her PTH and 1,25 vitamin D levels are higher. She needs to take both extra calcium and extra vitamin D.    PLAN:  1. Diagnostic: Draw CBC, iron, ferritin today. Repeat thyroid tests, lipid panel, calcium, PTH, and both vitamin D tests in 6 months.  2. Therapeutic: Continue Synthroid doses of  25 mcg 6 days per week and one 50 mcg pill on the seventh day. Add third Lovaza capsule at lunch or dinner. Add one good MVI for women and one Citracal-D per day, equivalent to about 1000 mg calcium and 800 IU vitamin D.  3. Patient education: We discussed Hashimoto's disease and vitamin D deficiency, hyperparathyroidism, and osteoporosis at great length. We also discussed causes of fatigue.  4. Follow-up: 6 months  Level of Service: This visit lasted in excess of 40 minutes. More than 50% of the visit was devoted to counseling.  David Stall, MD 04/20/2013 9:44 AM

## 2013-05-06 ENCOUNTER — Telehealth: Payer: Self-pay | Admitting: "Endocrinology

## 2013-05-06 DIAGNOSIS — E063 Autoimmune thyroiditis: Secondary | ICD-10-CM

## 2013-05-06 MED ORDER — LEVOTHYROXINE SODIUM 25 MCG PO TABS: ORAL_TABLET | ORAL | Status: DC

## 2013-05-06 NOTE — Telephone Encounter (Signed)
1. I contacted [name] with the results of her lab tests from 04/20/13: Her CMP, CBC, serum iron and ferritin were all normal. Her ferritin was at the lower end of the normal range, which might cause some restless leg symptoms. 2. She is not bothered by restless legs. She is just more tired than she feels she should be. She is still taking Synthroid, 50 mcg one day per week and 25 mcg on the remaining 6 days of the week. She would like to increase the Synthroid dose slightly. 3. I reviewed her TFTs from 04/08/13. She was mid-range euthyroid then. I suggested increasing her Synthroid to 50 mcg two days per week and continuing the 25 mcg dose for the remaining five days of the week.  We will need to repeat her TFTs in two months. She concurred. David Stall

## 2013-05-16 ENCOUNTER — Other Ambulatory Visit: Payer: Self-pay | Admitting: "Endocrinology

## 2013-05-29 ENCOUNTER — Telehealth: Payer: Self-pay | Admitting: Internal Medicine

## 2013-05-29 NOTE — Telephone Encounter (Signed)
[name]    Call pt and let her know that I have seen her mammogram report form [name].  REports startes benign findings.  She can go back to yearly exams

## 2013-05-30 ENCOUNTER — Encounter: Payer: Self-pay | Admitting: *Deleted

## 2013-05-30 ENCOUNTER — Encounter: Payer: Self-pay | Admitting: Gynecology

## 2013-05-30 NOTE — Telephone Encounter (Signed)
Notified pt of MM results  

## 2013-06-07 ENCOUNTER — Other Ambulatory Visit: Payer: Self-pay | Admitting: "Endocrinology

## 2013-08-09 ENCOUNTER — Encounter: Payer: Self-pay | Admitting: Internal Medicine

## 2013-08-09 ENCOUNTER — Ambulatory Visit (INDEPENDENT_AMBULATORY_CARE_PROVIDER_SITE_OTHER): Payer: BC Managed Care – PPO | Admitting: Internal Medicine

## 2013-08-09 VITALS — BP 121/62 | HR 60 | Temp 98.3°F | Resp 18 | Wt 149.0 lb

## 2013-08-09 DIAGNOSIS — E039 Hypothyroidism, unspecified: Secondary | ICD-10-CM

## 2013-08-09 DIAGNOSIS — R5383 Other fatigue: Secondary | ICD-10-CM

## 2013-08-09 DIAGNOSIS — R5381 Other malaise: Secondary | ICD-10-CM

## 2013-08-09 DIAGNOSIS — E069 Thyroiditis, unspecified: Secondary | ICD-10-CM

## 2013-08-09 DIAGNOSIS — Z23 Encounter for immunization: Secondary | ICD-10-CM

## 2013-08-09 LAB — COMPREHENSIVE METABOLIC PANEL
ALT: 12 U/L (ref 0–35)
CO2: 26 mEq/L (ref 19–32)
Calcium: 9.2 mg/dL (ref 8.4–10.5)
Chloride: 106 mEq/L (ref 96–112)
Creat: 0.63 mg/dL (ref 0.50–1.10)

## 2013-08-09 LAB — T3, FREE: T3, Free: 3.2 pg/mL (ref 2.3–4.2)

## 2013-08-09 NOTE — Patient Instructions (Addendum)
Call Jeannie Done    [phone]  Will get labs today

## 2013-08-09 NOTE — Progress Notes (Signed)
Subjective:    Patient ID: [name], female    DOB: [birth year], 47 y.o.   MRN: [id number]  HPI [name] is here for acute visit.   She is concerned about extreme fatigue.  She has seen Dr. Darnelle Bos at [facility] for this and anemia and thyroid issues all normal.    She tells me she did have Dr. Darnelle Bos say it was oK to take an extra 1/2 synthroid per week  She has been under considerable stress .  Her daughter had considerable vaginal bleeding and subsequent hysterectomy which she needed a legal order for .  Done at [facility].  Her daughters WBC count also became neurtopenic.   Summer has been very stressful  Nija is sleeping OK.   she denies depression 3 times during interview.  She has a new dog in the house.  Husband travelling frequently.  She tells me she cannot tolerate anti-depressants  She has been advised by Dr. Darnelle Bos to take daily vitamin D but sh ehas not been doing this  Allergies  Allergen Reactions  . Biaxin [Clarithromycin]   . Clarithromycin Diarrhea    Chest pain  . Codeine Diarrhea    Chest pain  . Doxycycline Diarrhea    Chest pain  . Levofloxacin     diarrhea  . Penicillins Diarrhea    Chest pain  . Zetia [Ezetimibe] Other (See Comments)    Abdominal pain  . Tape Rash    Sunburn like rash   Past Medical History  Diagnosis Date  . Thyroid disease     Hypothyroid  . Elevated cholesterol   . Fibroid   . Vulvodynia   . Cholecystitis chronic, acute   . Ovarian cyst, right   . GERD (gastroesophageal reflux disease)   . Constipation   . Hypothyroidism, acquired, autoimmune   . Thyroiditis, autoimmune   . Fatigue   . Vitamin D deficiency disease   . Dyspepsia   . Goiter, nontoxic, multinodular    Past Surgical History  Procedure Laterality Date  . Abdominal hysterectomy      TAH  . Temporomandibular joint surgery    . Cesarean section     History   Social History  . Marital Status: Married    Spouse Name: N/A    Number of Children: N/A  .  Years of Education: N/A   Occupational History  . Not on file.   Social History Main Topics  . Smoking status: Never Smoker   . Smokeless tobacco: Never Used  . Alcohol Use: 0.6 oz/week    1 Glasses of wine per week     Comment: occasional  . Drug Use: No  . Sexual Activity: Yes    Birth Control/ Protection: Surgical   Other Topics Concern  . Not on file   Social History Narrative  . No narrative on file   Family History  Problem Relation Age of Onset  . Thyroid disease Mother   . Thyroid disease Maternal Grandmother   . Thyroid disease Sister   . Diabetes Paternal Grandfather   . Cancer Neg Hx    Patient Active Problem List   Diagnosis Date Noted  . Fibroadenoma of right breast 05/26/2012  . Chronic pelvic pain in female 12/23/2011  . Hypothyroidism, acquired, autoimmune   . Thyroiditis, autoimmune   . Goiter   . Fatigue   . Vitamin D deficiency disease   . Dyspepsia   . Goiter, nontoxic, multinodular   . Anxiety 09/25/2011  . Ovarian  cyst, right   . Constipation   . Acute cholecystitis 07/16/2011  . Thyroid disease   . Elevated cholesterol   . Fibroid   . Vulvodynia   . Other specified acquired hypothyroidism 03/12/2011  . Pure hypercholesterolemia 03/12/2011  . Unspecified vitamin D deficiency 03/12/2011   Current Outpatient Prescriptions on File Prior to Visit  Medication Sig Dispense Refill  . lansoprazole (PREVACID) 30 MG capsule Take 30 mg by mouth daily.        Marland Kitchen levothyroxine (SYNTHROID) 25 MCG tablet Take 2 tablets on Wednesdays and Sundays. Take one tablet per day on the remaining 5 days of the week.  35 tablet  6  . Multiple Vitamin (MULTIVITAMIN) tablet Take 1 tablet by mouth daily.        Marland Kitchen omega-3 acid ethyl esters (LOVAZA) 1 G capsule TAKE 2 CAPSULES BY MOUTH TWICE DAILY  360 capsule  0  . Probiotic Product (PROBIOTIC PO) Take 1 tablet by mouth daily.        Marland Kitchen ALPRAZolam (XANAX) 0.5 MG tablet Take one tablet twice a day as needed for sleep or  anxiety  30 tablet  1  . Calcium Carbonate-Vitamin D (CALCIUM + D PO) Take by mouth 2 (two) times daily.         No current facility-administered medications on file prior to visit.       Review of Systems See HPI    Objective:   Physical Exam Physical Exam  Nursing note and vitals reviewed.  Constitutional: She is oriented to person, place, and time. She appears well-developed and well-nourished.  HENT:  Head: Normocephalic and atraumatic.  Cardiovascular: Normal rate and regular rhythm. Exam reveals no gallop and no friction rub.  No murmur heard.  Pulmonary/Chest: Breath sounds normal. She has no wheezes. She has no rales.  Neurological: She is alert and oriented to person, place, and time.  Skin: Skin is warm and dry.  Psychiatric: She has a normal mood and affect. Her behavior is normal.             Assessment & Plan:  Fatigue  Likely multifactorial  Metabolic etiologies ruled out.   Will check vitamin D today   I gave number to [employer]  And pt will call  Hypothyroidism  Since she has increased her synthroid by 1/2 pill will check today  Schedule CPE with me     Flu vaccine today

## 2013-08-10 LAB — VITAMIN D 25 HYDROXY (VIT D DEFICIENCY, FRACTURES): Vit D, 25-Hydroxy: 31 ng/mL (ref 30–89)

## 2013-08-12 ENCOUNTER — Telehealth: Payer: Self-pay | Admitting: *Deleted

## 2013-08-12 ENCOUNTER — Encounter: Payer: Self-pay | Admitting: *Deleted

## 2013-08-12 NOTE — Telephone Encounter (Signed)
Message copied by Mathews Robinsons on Fri Aug 12, 2013 10:24 AM ------      Message from: Raechel Chute D      Created: Fri Aug 12, 2013  9:24 AM       Call Tresa Endo and let her know all labs look normal including vitamin D            Ok to mail labs to pt ------

## 2013-08-12 NOTE — Telephone Encounter (Signed)
Notified pt of normal lab results 

## 2013-08-13 ENCOUNTER — Other Ambulatory Visit: Payer: Self-pay | Admitting: "Endocrinology

## 2013-08-16 NOTE — Telephone Encounter (Signed)
error 

## 2013-08-22 ENCOUNTER — Ambulatory Visit (INDEPENDENT_AMBULATORY_CARE_PROVIDER_SITE_OTHER): Payer: BC Managed Care – PPO | Admitting: Internal Medicine

## 2013-08-22 ENCOUNTER — Encounter: Payer: Self-pay | Admitting: Internal Medicine

## 2013-08-22 VITALS — BP 102/60 | HR 62 | Temp 98.2°F | Resp 20 | Wt 150.0 lb

## 2013-08-22 DIAGNOSIS — R05 Cough: Secondary | ICD-10-CM

## 2013-08-22 DIAGNOSIS — R059 Cough, unspecified: Secondary | ICD-10-CM

## 2013-08-22 DIAGNOSIS — J209 Acute bronchitis, unspecified: Secondary | ICD-10-CM

## 2013-08-22 MED ORDER — AZITHROMYCIN 250 MG PO TABS: ORAL_TABLET | ORAL | Status: DC

## 2013-08-22 MED ORDER — ALBUTEROL SULFATE HFA 108 (90 BASE) MCG/ACT IN AERS: 2.0000 | INHALATION_SPRAY | Freq: Four times a day (QID) | RESPIRATORY_TRACT | Status: DC | PRN

## 2013-08-22 MED ORDER — METHYLPREDNISOLONE ACETATE 80 MG/ML IJ SUSP: 120.0000 mg | Freq: Once | INTRAMUSCULAR | Status: DC

## 2013-08-22 NOTE — Progress Notes (Signed)
Subjective:    Patient ID: [name], female    DOB: [birth year], 47 y.o.   MRN: [id number]  HPI  [name] is here for acuter visit  Persistant cough, nasal congestion,  Slight sore throat.  No fever.   Cannot tolerate codeine and Tessalon perles not helping  Allergies  Allergen Reactions  . Biaxin [Clarithromycin]   . Clarithromycin Diarrhea    Chest pain  . Codeine Diarrhea    Chest pain  . Doxycycline Diarrhea    Chest pain  . Levofloxacin     diarrhea  . Penicillins Diarrhea    Chest pain  . Zetia [Ezetimibe] Other (See Comments)    Abdominal pain  . Tape Rash    Sunburn like rash   Past Medical History  Diagnosis Date  . Thyroid disease     Hypothyroid  . Elevated cholesterol   . Fibroid   . Vulvodynia   . Cholecystitis chronic, acute   . Ovarian cyst, right   . GERD (gastroesophageal reflux disease)   . Constipation   . Hypothyroidism, acquired, autoimmune   . Thyroiditis, autoimmune   . Fatigue   . Vitamin D deficiency disease   . Dyspepsia   . Goiter, nontoxic, multinodular    Past Surgical History  Procedure Laterality Date  . Abdominal hysterectomy      TAH  . Temporomandibular joint surgery    . Cesarean section     History   Social History  . Marital Status: Married    Spouse Name: N/A    Number of Children: N/A  . Years of Education: N/A   Occupational History  . Not on file.   Social History Main Topics  . Smoking status: Never Smoker   . Smokeless tobacco: Never Used  . Alcohol Use: 0.6 oz/week    1 Glasses of wine per week     Comment: occasional  . Drug Use: No  . Sexual Activity: Yes    Birth Control/ Protection: Surgical   Other Topics Concern  . Not on file   Social History Narrative  . No narrative on file   Family History  Problem Relation Age of Onset  . Thyroid disease Mother   . Thyroid disease Maternal Grandmother   . Thyroid disease Sister   . Diabetes Paternal Grandfather   . Cancer Neg Hx    Patient  Active Problem List   Diagnosis Date Noted  . Fibroadenoma of right breast 05/26/2012  . Chronic pelvic pain in female 12/23/2011  . Hypothyroidism, acquired, autoimmune   . Thyroiditis, autoimmune   . Goiter   . Fatigue   . Vitamin D deficiency disease   . Dyspepsia   . Goiter, nontoxic, multinodular   . Anxiety 09/25/2011  . Ovarian cyst, right   . Constipation   . Acute cholecystitis 07/16/2011  . Thyroid disease   . Elevated cholesterol   . Fibroid   . Vulvodynia   . Other specified acquired hypothyroidism 03/12/2011  . Pure hypercholesterolemia 03/12/2011  . Unspecified vitamin D deficiency 03/12/2011   Current Outpatient Prescriptions on File Prior to Visit  Medication Sig Dispense Refill  . ALPRAZolam (XANAX) 0.5 MG tablet Take one tablet twice a day as needed for sleep or anxiety  30 tablet  1  . Calcium Carbonate-Vitamin D (CALCIUM + D PO) Take by mouth 2 (two) times daily.        . lansoprazole (PREVACID) 30 MG capsule Take 30 mg by mouth daily.        Marland Kitchen  levothyroxine (SYNTHROID) 25 MCG tablet Take 2 tablets on Wednesdays and Sundays. Take one tablet per day on the remaining 5 days of the week.  35 tablet  6  . meloxicam (MOBIC) 15 MG tablet Take 15 mg by mouth daily.      . Multiple Vitamin (MULTIVITAMIN) tablet Take 1 tablet by mouth daily.        Marland Kitchen omega-3 acid ethyl esters (LOVAZA) 1 G capsule TAKE 2 CAPSULES BY MOUTH TWICE DAILY  360 capsule  0  . Probiotic Product (PROBIOTIC PO) Take 1 tablet by mouth daily.         No current facility-administered medications on file prior to visit.      Review of Systems    see HPI Objective:   Physical Exam Physical Exam  Nursing note and vitals reviewed.  Constitutional: She is oriented to person, place, and time. She appears well-developed and well-nourished. She is cooperative.  HENT:  Head: Normocephalic and atraumatic.  Nose: Mucosal edema present.  Eyes: Conjunctivae and EOM are normal. Pupils are equal,  round, and reactive to light.  Neck: Neck supple.  Cardiovascular: Regular rhythm, normal heart sounds, intact distal pulses and normal pulses. Exam reveals no gallop and no friction rub.  No murmur heard.  Pulmonary/Chest: She has no wheezes. She has rhonchi. She has no rales.  Neurological: She is alert and oriented to person, place, and time.  Skin: Skin is warm and dry. No abrasion, no bruising, no ecchymosis and no rash noted. No cyanosis. Nails show no clubbing.  Psychiatric: She has a normal mood and affect. Her speech is normal and behavior is normal.            Assessment & Plan:  Bronchitis  Z-pak  CougH  Cannot tolerate codeine or deriviatives  Will give depo-medrol 120 mg Im in office and can try Albuterol MDI   She is to call if not better and will need CXR at thata time

## 2013-08-22 NOTE — Patient Instructions (Addendum)
Call if not better 

## 2013-08-25 ENCOUNTER — Other Ambulatory Visit: Payer: Self-pay | Admitting: *Deleted

## 2013-08-25 MED ORDER — BENZONATATE 100 MG PO CAPS: 100.0000 mg | ORAL_CAPSULE | Freq: Three times a day (TID) | ORAL | Status: DC | PRN

## 2013-08-25 NOTE — Telephone Encounter (Signed)
Pt requesting a refill on tessalon. Informed her that a rx was sent in on 10/06

## 2013-08-25 NOTE — Telephone Encounter (Signed)
Pt called pharmacy which stated that they did not receive a rx for tessalon resent rx

## 2013-08-30 ENCOUNTER — Telehealth: Payer: Self-pay | Admitting: *Deleted

## 2013-08-30 NOTE — Telephone Encounter (Signed)
[name] called this morning said she is feeling bad again. She said she feels fluid in her ears again.  We offered to make her an appt with covering physician, but she declined.  She said that she had a busy day today, and her child had appt in [facility] tomorrow.

## 2013-09-06 ENCOUNTER — Ambulatory Visit (INDEPENDENT_AMBULATORY_CARE_PROVIDER_SITE_OTHER): Payer: BC Managed Care – PPO | Admitting: Internal Medicine

## 2013-09-06 ENCOUNTER — Ambulatory Visit (HOSPITAL_BASED_OUTPATIENT_CLINIC_OR_DEPARTMENT_OTHER)
Admission: RE | Admit: 2013-09-06 | Discharge: 2013-09-06 | Disposition: A | Discharge status: Home / Self Care | Payer: BC Managed Care – PPO | Source: Ambulatory Visit | Attending: Internal Medicine | Admitting: Internal Medicine

## 2013-09-06 ENCOUNTER — Encounter: Payer: Self-pay | Admitting: Internal Medicine

## 2013-09-06 ENCOUNTER — Telehealth: Payer: Self-pay | Admitting: Internal Medicine

## 2013-09-06 VITALS — BP 113/62 | HR 100 | Temp 98.2°F | Resp 18 | Wt 150.0 lb

## 2013-09-06 DIAGNOSIS — Z0000 Encounter for general adult medical examination without abnormal findings: Secondary | ICD-10-CM

## 2013-09-06 DIAGNOSIS — D241 Benign neoplasm of right breast: Secondary | ICD-10-CM

## 2013-09-06 DIAGNOSIS — R05 Cough: Secondary | ICD-10-CM | POA: Insufficient documentation

## 2013-09-06 DIAGNOSIS — Z23 Encounter for immunization: Secondary | ICD-10-CM

## 2013-09-06 DIAGNOSIS — E785 Hyperlipidemia, unspecified: Secondary | ICD-10-CM

## 2013-09-06 DIAGNOSIS — M47814 Spondylosis without myelopathy or radiculopathy, thoracic region: Secondary | ICD-10-CM | POA: Insufficient documentation

## 2013-09-06 DIAGNOSIS — D249 Benign neoplasm of unspecified breast: Secondary | ICD-10-CM

## 2013-09-06 DIAGNOSIS — E049 Nontoxic goiter, unspecified: Secondary | ICD-10-CM

## 2013-09-06 DIAGNOSIS — R059 Cough, unspecified: Secondary | ICD-10-CM | POA: Insufficient documentation

## 2013-09-06 LAB — POCT URINALYSIS DIPSTICK
Ketones, UA: NEGATIVE
Leukocytes, UA: NEGATIVE
Nitrite, UA: NEGATIVE
Protein, UA: NEGATIVE
Urobilinogen, UA: NEGATIVE
pH, UA: 6

## 2013-09-06 LAB — LIPID PANEL
Cholesterol: 190 mg/dL (ref 0–200)
Total CHOL/HDL Ratio: 3.5 Ratio

## 2013-09-06 MED ORDER — ALBUTEROL SULFATE HFA 108 (90 BASE) MCG/ACT IN AERS: 2.0000 | INHALATION_SPRAY | Freq: Four times a day (QID) | RESPIRATORY_TRACT | Status: DC | PRN

## 2013-09-06 MED ORDER — METHYLPREDNISOLONE ACETATE 80 MG/ML IJ SUSP
120.0000 mg | Freq: Once | INTRAMUSCULAR | Status: AC
  Administered 2013-09-06: 120 mg via INTRAMUSCULAR

## 2013-09-06 MED ORDER — CLINDAMYCIN HCL 300 MG PO CAPS: ORAL_CAPSULE | ORAL | Status: DC

## 2013-09-06 MED ORDER — PREDNISONE 20 MG PO TABS: ORAL_TABLET | ORAL | Status: DC

## 2013-09-06 NOTE — Telephone Encounter (Signed)
Notified pt of negative chest x ray

## 2013-09-06 NOTE — Progress Notes (Signed)
Subjective:    Patient ID: [name], female    DOB: 1966-05-11, 47 y.o.   MRN: [id number]  HPI [name] is here for CPE  She has persistant cough that is dry.  She did complete a Z-pak ,   She is not using the Albuterol.  She does say that DAyquil and nyquil will help her.  She has some wheezing at times.  [name] will be travelling to [place] to visit with her son who is taking college courses there  She is S/P hysterectomy for benign causes.  No FH of gyn cancer  See lipids  Will recheck today  She is having some sinus pressure  No fever  No daily nasal discharge.  The prior medrol injection did help cough some  Allergies  Allergen Reactions  . Biaxin [Clarithromycin]   . Clarithromycin Diarrhea    Chest pain  . Codeine Diarrhea    Chest pain  . Doxycycline Diarrhea    Chest pain  . Levofloxacin     diarrhea  . Penicillins Diarrhea    Chest pain  . Zetia [Ezetimibe] Other (See Comments)    Abdominal pain  . Tape Rash    Sunburn like rash   Past Medical History  Diagnosis Date  . Thyroid disease     Hypothyroid  . Elevated cholesterol   . Fibroid   . Vulvodynia   . Cholecystitis chronic, acute   . Ovarian cyst, right   . GERD (gastroesophageal reflux disease)   . Constipation   . Hypothyroidism, acquired, autoimmune   . Thyroiditis, autoimmune   . Fatigue   . Vitamin D deficiency disease   . Dyspepsia   . Goiter, nontoxic, multinodular    Past Surgical History  Procedure Laterality Date  . Abdominal hysterectomy      TAH  . Temporomandibular joint surgery    . Cesarean section     History   Social History  . Marital Status: Married    Spouse Name: N/A    Number of Children: N/A  . Years of Education: N/A   Occupational History  . Not on file.   Social History Main Topics  . Smoking status: Never Smoker   . Smokeless tobacco: Never Used  . Alcohol Use: 0.6 oz/week    1 Glasses of wine per week     Comment: occasional  . Drug Use: No  . Sexual  Activity: Yes    Birth Control/ Protection: Surgical   Other Topics Concern  . Not on file   Social History Narrative  . No narrative on file   Family History  Problem Relation Age of Onset  . Thyroid disease Mother   . Thyroid disease Maternal Grandmother   . Thyroid disease Sister   . Diabetes Paternal Grandfather   . Cancer Neg Hx    Patient Active Problem List   Diagnosis Date Noted  . Cough 09/06/2013  . Fibroadenoma of right breast 05/26/2012  . Chronic pelvic pain in female 12/23/2011  . Hypothyroidism, acquired, autoimmune   . Thyroiditis, autoimmune   . Goiter   . Fatigue   . Vitamin D deficiency disease   . Dyspepsia   . Goiter, nontoxic, multinodular   . Anxiety 09/25/2011  . Ovarian cyst, right   . Constipation   . Acute cholecystitis 07/16/2011  . Thyroid disease   . Elevated cholesterol   . Fibroid   . Vulvodynia   . Other specified acquired hypothyroidism 03/12/2011  . Pure hypercholesterolemia 03/12/2011  .  Unspecified vitamin D deficiency 03/12/2011   Current Outpatient Prescriptions on File Prior to Visit  Medication Sig Dispense Refill  . ALPRAZolam (XANAX) 0.5 MG tablet Take one tablet twice a day as needed for sleep or anxiety  30 tablet  1  . Calcium Carbonate-Vitamin D (CALCIUM + D PO) Take by mouth 2 (two) times daily.        . lansoprazole (PREVACID) 30 MG capsule Take 30 mg by mouth daily.        Marland Kitchen levothyroxine (SYNTHROID) 25 MCG tablet Take 2 tablets on Wednesdays and Sundays. Take one tablet per day on the remaining 5 days of the week.  35 tablet  6  . Multiple Vitamin (MULTIVITAMIN) tablet Take 1 tablet by mouth daily.        Marland Kitchen omega-3 acid ethyl esters (LOVAZA) 1 G capsule TAKE 2 CAPSULES BY MOUTH TWICE DAILY  360 capsule  0  . Probiotic Product (PROBIOTIC PO) Take 1 tablet by mouth daily.        . benzonatate (TESSALON PERLES) 100 MG capsule Take 1 capsule (100 mg total) by mouth 3 (three) times daily as needed for cough.  20 capsule   0  . meloxicam (MOBIC) 15 MG tablet Take 15 mg by mouth daily.       No current facility-administered medications on file prior to visit.         Review of Systems  Constitutional: Negative.   HENT: Positive for sinus pressure. Negative for ear discharge and sore throat.   Respiratory: Positive for cough. Negative for choking and shortness of breath.   Cardiovascular: Negative for chest pain.  All other systems reviewed and are negative.       Objective:   Physical Exam Physical Exam  Nursing note and vitals reviewed.  Constitutional: She is oriented to person, place, and time. She appears well-developed and well-nourished.  HENT:  Head: Normocephalic and atraumatic.  Right Ear: Tympanic membrane and ear canal normal. No drainage. Tympanic membrane is not injected and not erythematous.  Left Ear: Tympanic membrane and ear canal normal. No drainage. Tympanic membrane is not injected and not erythematous.  Nose: Nose normal. Right sinus exhibits no maxillary sinus tenderness and no frontal sinus tenderness. Left sinus exhibits no maxillary sinus tenderness and no frontal sinus tenderness.  Mouth/Throat: Oropharynx is clear and moist. No oral lesions. No oropharyngeal exudate.  Eyes: Conjunctivae and EOM are normal. Pupils are equal, round, and reactive to light.  Neck: Normal range of motion. Neck supple. No JVD present. Carotid bruit is not present. No mass and no thyromegaly present.  Cardiovascular: Normal rate, regular rhythm, S1 normal, S2 normal and intact distal pulses. Exam reveals no gallop and no friction rub.  No murmur heard.  Breast  No discrete masses no nipple dischage no axillary adenpathy bilaterally Pulses:  Carotid pulses are 2+ on the right side, and 2+ on the left side.  Dorsalis pedis pulses are 2+ on the right side, and 2+ on the left side.  No carotid bruit. No LE edema  Pulmonary/Chest: Breath sounds normal. She has no wheezes. She has no rales. She  exhibits no tenderness.  Abdominal: Soft. Bowel sounds are normal. She exhibits no distension and no mass. There is no hepatosplenomegaly. There is no tenderness. There is no CVA tenderness.  Pelvic limited to bimanual exam.   Nl external genitalia No adnexal masses  Musculoskeletal: Normal range of motion.  No active synovitis to joints.  Lymphadenopathy:  She has  no cervical adenopathy.  She has no axillary adenopathy.  Right: No inguinal and no supraclavicular adenopathy present.  Left: No inguinal and no supraclavicular adenopathy present.  Neurological: She is alert and oriented to person, place, and time. She has normal strength and normal reflexes. She displays no tremor. No cranial nerve deficit or sensory deficit. Coordination and gait normal.  Skin: Skin is warm and dry. No rash noted. No cyanosis. Nails show no clubbing.  Psychiatric: She has a normal mood and affect. Her speech is normal and behavior is normal. Cognition and memory are normal.           Assessment & Plan:  Healt maintenance  UTD with vaccines  Had colonoscopy 2009  Dr. Madilyn Fireman  Hypothyroidism  History of Hashimotos  TSH and free levels normal  Follow[facility] by [facility] Fransico Michael at [facility] cough/ possible sinusitis.  will get CXR today  Depomedrol 120 in office and 60 ng prednisone taper.  OK to use dayquil.  Counseled how to use albuterol MDI  Tid  Will empirically give clindamycin tid if she cannot tolerate ok to stop antibiotic  Hyperlipidemi a  Mild will recheck today  History of fibro adenoma of R breast  Anxiety  Ok for benzodiazepine prn  Use  GERD  contineu Prevacid  Pt is to call office on Thursdays prior to leaving town

## 2013-09-06 NOTE — Telephone Encounter (Signed)
[name]  Call [place] and let her know that her CXR is normal  Have her call the office Thursday to let us know how she is doing.   ADvise her to call around 2:30

## 2013-09-06 NOTE — Patient Instructions (Signed)
Call office Thursday   CXR today    Lipids today

## 2013-09-08 ENCOUNTER — Encounter: Payer: Self-pay | Admitting: *Deleted

## 2013-10-05 ENCOUNTER — Other Ambulatory Visit: Payer: Self-pay | Admitting: *Deleted

## 2013-10-05 DIAGNOSIS — E063 Autoimmune thyroiditis: Secondary | ICD-10-CM

## 2013-10-05 DIAGNOSIS — E559 Vitamin D deficiency, unspecified: Secondary | ICD-10-CM

## 2013-10-12 ENCOUNTER — Encounter: Payer: Self-pay | Admitting: "Endocrinology

## 2013-10-19 ENCOUNTER — Ambulatory Visit: Payer: BC Managed Care – PPO | Admitting: "Endocrinology

## 2013-10-25 ENCOUNTER — Other Ambulatory Visit: Payer: Self-pay | Admitting: *Deleted

## 2013-10-25 DIAGNOSIS — E559 Vitamin D deficiency, unspecified: Secondary | ICD-10-CM

## 2013-11-04 ENCOUNTER — Other Ambulatory Visit: Payer: Self-pay | Admitting: "Endocrinology

## 2013-11-21 LAB — TSH: TSH: 1.517 u[IU]/mL (ref 0.350–4.500)

## 2013-11-21 LAB — T4, FREE: Free T4: 1.35 ng/dL (ref 0.80–1.80)

## 2013-11-21 LAB — LIPID PANEL
Cholesterol: 191 mg/dL (ref 0–200)
HDL: 55 mg/dL (ref >39)
LDL Cholesterol: 127 mg/dL — ABNORMAL HIGH (ref 0–99)
Total CHOL/HDL Ratio: 3.5 Ratio
Triglycerides: 46 mg/dL (ref <150)
VLDL: 9 mg/dL (ref 0–40)

## 2013-11-21 LAB — T3, FREE: T3, Free: 2.9 pg/mL (ref 2.3–4.2)

## 2013-11-22 LAB — PTH, INTACT AND CALCIUM
Calcium: 8.9 mg/dL (ref 8.4–10.5)
PTH: 25.6 pg/mL (ref 14.0–72.0)

## 2013-11-22 LAB — VITAMIN D 25 HYDROXY (VIT D DEFICIENCY, FRACTURES): Vit D, 25-Hydroxy: 53 ng/mL (ref 30–89)

## 2013-11-23 LAB — VITAMIN D 1,25 DIHYDROXY
Vitamin D 1, 25 (OH)2 Total: 57 pg/mL (ref 18–72)
Vitamin D2 1, 25 (OH)2: <8 pg/mL
Vitamin D3 1, 25 (OH)2: 57 pg/mL

## 2013-11-29 ENCOUNTER — Ambulatory Visit (INDEPENDENT_AMBULATORY_CARE_PROVIDER_SITE_OTHER): Payer: 59 | Admitting: "Endocrinology

## 2013-11-29 ENCOUNTER — Encounter: Payer: Self-pay | Admitting: "Endocrinology

## 2013-11-29 VITALS — BP 112/68 | HR 63 | Wt 153.4 lb

## 2013-11-29 DIAGNOSIS — E211 Secondary hyperparathyroidism, not elsewhere classified: Secondary | ICD-10-CM

## 2013-11-29 DIAGNOSIS — R5383 Other fatigue: Secondary | ICD-10-CM

## 2013-11-29 DIAGNOSIS — R5381 Other malaise: Secondary | ICD-10-CM

## 2013-11-29 DIAGNOSIS — E063 Autoimmune thyroiditis: Secondary | ICD-10-CM

## 2013-11-29 DIAGNOSIS — E785 Hyperlipidemia, unspecified: Secondary | ICD-10-CM

## 2013-11-29 DIAGNOSIS — E038 Other specified hypothyroidism: Secondary | ICD-10-CM

## 2013-11-29 DIAGNOSIS — E559 Vitamin D deficiency, unspecified: Secondary | ICD-10-CM

## 2013-11-29 DIAGNOSIS — E049 Nontoxic goiter, unspecified: Secondary | ICD-10-CM

## 2013-11-29 NOTE — Progress Notes (Signed)
Subjective:  Patient Name: [name] Date of Birth: 04-07-1966  MRN: [id number]  [name]  presents to the office today for follow-up of her hypothyroidism, thyroiditis, vitamin D deficiency, GERD, dyspepsia, goiter, and fatigue.   HISTORY OF PRESENT ILLNESS:   [name] is a 48 y.o. Caucasian woman.   [name] was unaccompanied.  1. The patient was first referred to me on 03/24/05 by her primary care physician, Dr. Adriana Simas, for evaluation of a painful goiter and abnormal thyroid blood tests, possibly consistent with hyperthyroidism.   A. She was first recognized to have a goiter about 12 years previously when she was pregnant. Over time the goiter has waxed and waned in size, but gradually had become larger. At times when the thyroid gland was larger and more swollen, even mild direct pressure was very uncomfortable. She was diagnosed with hypothyroidism about 10-11 years previously. She was placed on Cytomel, but that caused her to be a "freaky lunatic". She was subsequently converted to Armour Thyroid. Patient also had one lab tests which show that she might have a low cortisol level. Therefore she had been started on Cortef, 10 mg per day.  B. At our first visit "she described herself as being "exhausted". Her energy was low. She was not sleeping very well. She was restless and frequently awakened during the night. She stated she snored like a truck driver. She had frequent night sweats. She occasionally noted that her heart rate was very fast and racing at times that such fast heart rate would not be expected. She was having a lot of reflux and dyspepsia symptoms that were being treated with Nexium. She was nervous and tremulous a lot. She was on an "emotional roller coaster". She had frequent problems with concentrating and remembering. She had a mild problem with hirsutism of her chin and upper lip. She also was on Lexapro for vulvovaginodynia. She had allergies to penicillin, Biaxin,  doxycycline, and many adhesives. Her ethnicity was Ashkenazy Jewish. Family history was positive for hypothyroidism in her mother, maternal grandmother and sister. Her paternal grandfather had diabetes. On physical examination, she looked tired. She had a 25-30 g thyroid gland. The thyroid gland was soft. Her thyroid gland was uncomfortable to palpation in both lobes. She showed no evidence of hyperpigmentation of the buccal mucosa, tongue, or skin. She had trace tremor of her hands.  C. Review of laboratory data dating back to 07/08/2004 showed that she had been persistently hyperthyroid from the point of view of her TSH. On 07/13/04 her TSH was 0.151, her free T4 was 1.01, her free T3 was 2.7. On 09/21/04 her TSH was 0.012. Her free T4 was 1.25. On 03/18/05 the TSH was 0.212, free T4 was 0.88, and free T3 was 2.7. Her TPO antibody was mildly elevated at 40.4. The pattern of abnormal, shifting TFTs was c/w flare-ups of Hashimoto's disease. However, since Armour Thyroid was dosed by iodine content rather than T4/T3 ratio, Armour thyroid could also contribute to unstable TFTs. I therefore discontinued her Armour Thyroid and began treatment with Synthroid, initially at 112 mcg/day, but rapidly tapering to 88 mcg/day.  D. To rule out insufficiency of the hypothalamic-pituitary-adrenal axis, an ACTH stimulation test was performed on 03/28/05. At time 0, her ACTH was 12 and her cortisol was 13.1. At time +30 minutes her cortisol increased to 24.5. At time +60 minutes her cortisol increased even further to 29.1. This was a perfectly normal test of the HPA axis. I therefore discontinued her cortisol  treatment.   2. During the past eight years, we've followed her TFTs about every 3-6 months, and we've adjusted her Synthroid dose as needed. Her most recent Synthroid dose regimen is 50 mcg two days per week and 25 mcg five days per week. We've found it difficult to treat her hypercholesterolemia because she's had adverse  reactions to all of the statins we've tried and to Zetia as well. Unfortunately, she had an attack of apparent cholecystitis in July 2012. She was evaluated by general surgery, but the gall bladder tests were normal. She has not had a recurrence of that pain.    3. The patient's last [facility] visit was on 05/06/13.   A. In the interim she has been healthy, but is very fatigued. Her daughter, [name], has had terrible headaches, has been admitted to [facility] several tines, but no specific diagnosis has been made and no therapy has been successful. [name] has not had a good night's sleep in months. She has not been able to exercise much and has regained weight.   BAndee Poles did have her partial hysterectomy. Her blood count has recovered. 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 [name]'s estrogen was stopped to see if it was causing the headaches.     C. [name]'s current Synthroid dose is 25 mcg five days per week and 50 mcg on Wednesday and Sunday. She takes vitamin D, but no calcium. She is still taking Prevacid daily. She also takes 3 Lovaza gelcaps daily at breakfast.  4. Pertinent Review of Systems:  Constitutional: The patient feels "exhausted and frustrated". Her energy level is also "very low". She often feels overwhelmed. She is "colder, especially about 5 PM."  Hot flashes have resolved.  Eyes: Vision is good with her glasses. There are no significant eye complaints. Neck: The patient occasionally feels some anterior neck pressure sensations, but no swelling, soreness, tenderness,  discomfort, or difficulty swallowing.  Heart: She occasionally feels a rapid heart rate for less than a minute. Heart rate increases with exercise or other physical activity. The patient has no complaints of palpitations, irregular heat beats, chest pain, or chest pressure. Gastrointestinal: She still takes two milk of magnesia tablets each evening to treat her constipation.  Bowel movents seem normal when using MOM. The patient has no complaints of excessive hunger, acid reflux, upset stomach, stomach pains, or diarrhea. Legs: Muscle mass and strength seem normal. There are no complaints of numbness, tingling, burning, or pain. No edema is noted. Feet: There are no complaints of numbness, tingling, burning, or pain. No edema is noted. Emotional: She feels "I'm stressed, but not depressed.".  Mental: "My memory is awful. I'm a little foggy at times." . GYN: She had an abdominal hysterectomy at age 47, but her ovaries remain in place.    PAST MEDICAL, FAMILY, AND SOCIAL HISTORY:  Past Medical History  Diagnosis Date  . Thyroid disease     Hypothyroid  . Elevated cholesterol   . Fibroid   . Vulvodynia   . Cholecystitis chronic, acute   . Ovarian cyst, right   . GERD (gastroesophageal reflux disease)   . Constipation   . Hypothyroidism, acquired, autoimmune   . Thyroiditis, autoimmune   . Fatigue   . Vitamin D deficiency disease   . Dyspepsia   . Goiter, nontoxic, multinodular     Family History  Problem Relation Age of Onset  . Thyroid disease Mother   . Thyroid  disease Maternal Grandmother   . Thyroid disease Sister   . Diabetes Paternal Grandfather   . Cancer Neg Hx     Current outpatient prescriptions:ALPRAZolam (XANAX) 0.5 MG tablet, Take one tablet twice a day as needed for sleep or anxiety, Disp: 30 tablet, Rfl: 1;  Calcium Carbonate-Vitamin D (CALCIUM + D PO), Take by mouth 2 (two) times daily.  , Disp: , Rfl: ;  lansoprazole (PREVACID) 30 MG capsule, Take 30 mg by mouth daily.  , Disp: , Rfl: ;  omega-3 acid ethyl esters (LOVAZA) 1 G capsule, TAKE 2 CAPSULES BY MOUTH TWICE DAILY, Disp: 360 capsule, Rfl: 0 Probiotic Product (PROBIOTIC PO), Take 1 tablet by mouth daily.  , Disp: , Rfl: ;  SYNTHROID 25 MCG tablet, TAKE 2 TABLETS ON WEDNESDAYS AND SUNDAYS, THEN TAKE 1 TABLET PER DAY THE REMAINING 5 DAYS OF THE WEEK, Disp: 35 tablet, Rfl: 0;   albuterol (PROVENTIL HFA;VENTOLIN HFA) 108 (90 BASE) MCG/ACT inhaler, Inhale 2 puffs into the lungs every 6 (six) hours as needed for wheezing., Disp: 1 Inhaler, Rfl: 0 benzonatate (TESSALON PERLES) 100 MG capsule, Take 1 capsule (100 mg total) by mouth 3 (three) times daily as needed for cough., Disp: 20 capsule, Rfl: 0;  clindamycin (CLEOCIN) 300 MG capsule, One tablet tid for 5 days, Disp: 15 capsule, Rfl: 0;  meloxicam (MOBIC) 15 MG tablet, Take 15 mg by mouth daily., Disp: , Rfl: ;  Multiple Vitamin (MULTIVITAMIN) tablet, Take 1 tablet by mouth daily.  , Disp: , Rfl:  predniSONE (DELTASONE) 20 MG tablet, Take 3 tablets daily for 3 days then 2 tablets daily for 3 days then 1 tablet daily for 3 days then stop, Disp: 18 tablet, Rfl: 0  Allergies as of 11/29/2013 - Review Complete 11/29/2013  Allergen Reaction Noted  . Biaxin [clarithromycin]  08/09/2013  . Clarithromycin Diarrhea 03/12/2011  . Codeine Diarrhea 07/07/2011  . Doxycycline Diarrhea 07/07/2011  . Levofloxacin  10/20/2011  . Penicillins Diarrhea 03/12/2011  . Zetia [ezetimibe] Other (See Comments) 03/12/2011  . Tape Rash 09/25/2011    1. Work and Family: She continues to spend quite a bit of time taking care of her daughter, [name]. 2. Activities: Wife, mother, and homemaker 3. Smoking, alcohol, or drugs:  No tobacco or illicit drugs. 4. Primary Care Provider: Kelton Pillar, MD  REVIEW OF SYSTEMS: There are no other significant problems involving Aneka's other body systems.   Objective:  Vital Signs:  BP 112/68  Pulse 63  Wt 153 lb 6.4 oz (69.582 kg) She has lost one pound since last visit.   Ht Readings from Last 3 Encounters:  08/27/12 5\' 2"  (1.575 m)  08/18/12 5\' 2"  (1.575 m)  07/28/12 5\' 1"  (1.549 m)   Wt Readings from Last 3 Encounters:  11/29/13 153 lb 6.4 oz (69.582 kg)  09/06/13 150 lb (68.04 kg)  08/22/13 150 lb (68.04 kg)   PHYSICAL EXAM:  Constitutional: The patient is alert and oriented x3. She  looks more pale and more tired, but is mentally quite sharp. She has gained 8 pounds since last visit  Face: The face appears normal.  No evidence of hirsutism.  Eyes: There is no obvious arcus or proptosis. Moisture appears normal. Mouth: The oropharynx and tongue appear normal. Dentition appears to be normal for age. Oral moisture is normal. Neck: The neck appears to be visibly normal. No carotid bruits are noted. The thyroid gland is about 23 grams in size. The right lobe is within normal limits for size.  The left lobe is mildly enlarged. The consistency of the thyroid gland is normal. The thyroid gland is mildly uncomfortable to palpation on the left. Lungs: The lungs are clear to auscultation. Air movement is good. Heart: Heart rate and rhythm are regular. Heart sounds S1 and S2 are normal. I did not appreciate any pathologic cardiac murmurs. Abdomen: The abdomen is somewhat enlarged in size. Bowel sounds are normal. There is no obvious hepatomegaly, splenomegaly, or other mass effect.  Arms: Muscle size and bulk are normal for age. Hands: There is no tremor of her outstretched hands. Phalangeal and metacarpophalangeal joints are normal. Palmar muscles are normal. Palmar skin is normal. Palmar moisture is also normal.  Legs: Muscles appear normal for age. No edema is present.  Neurologic: Strength is normal for age in both the upper and lower extremities. Muscle tone is normal. Sensation to touch is normal in both legs.   Skin: Her face and body are unusually pale for her.  LAB DATA:  11/21/13: TSH 1.517, free T4 1.27, free T3 2.9; calcium 8.9, PTH 25.6, 25-hydroxy vitamin D 53, 1,25-dihydroxy vitamin D 57; cholesterol 191, triglycerides 46, HDL 55, LDL 127  04/08/13: TSH 1.648, free T4 1.46, free T3 3.4. Calcium 93, PTH 51.4, 25-hydroxy vitamin D 27, 1,25-dihydroxy vitamin D 74, cholesterol 174, triglycerides 61, HDL 50, LDL 132  09/14/12: TSH 1.453, free T4 1.28, free T3 3.2, cholesterol 174,  triglycerides 52, HDL 56, LDL 108.  02/04/12: TSH 1.893, free T4 1.37, free T3 3.0                     12/10/11: TSH 1.091, free T4  1.31, free T3 2.8, cholesterol 185, triglycerides 60, HDL 55, and LDL 118   Assessment and Plan:   ASSESSMENT:  1. Hypothyroidism: Patient was euthyroid in 2013, in 2014,  and again this month on her current doses of Synthroid. 2. Thyroiditis: Her thyroiditis is mildly active today, but much less so than at last visit. In the last 7 months all 3 TFTs have increased in the same direction, c/w a recent flare up of thyroiditis.Her pattern is to have episodic inflammation.   3. Goiter: Thyroid gland is at about the same size today as at last visit. The waxing and waning of the thyroid gland size over time is consistent with evolving Hashimoto's disease. 4. Fatigue: This problem is even worse at this visit. Her CBC in June 2013 was normal, with no sign of anemia or iron deficiency. The worsening problems with her daughter are certainly causing even more stress. Simon may not be sleeping as well as she thinks.   5. Dyslipidemia: Her lipids are about the same.  Unfortunately, she is not exercising as much as before.  6. Vitamin D deficiency: Her 25-OH vitamin D level is higher. Her PTH and 1,25 vitamin D levels are lower. Her calcium is lower. She needs to take more calcium and continue her vitamin D as is.     PLAN:  1. Diagnostic: Repeat thyroid tests, lipid panel, calcium, PTH, both vitamin D tests, and CBC in 6 months.  2. Therapeutic: Continue Synthroid doses of  25 mcg 5 days per week and one 50 mcg pill on Wednesday and Sunday.  Add fourth Lovaza capsule, taking two capsules twice daily. Add one good MVI for women and one Citracal per day, equivalent to about 1000 mg calcium. Continue vitamin D as is.   3. Patient education: We discussed Hashimoto's disease, hypothyroidism, vitamin D  deficiency, calcium deficiency, hyperparathyroidism, and osteoporosis at great  length. We also discussed causes of fatigue. Try to fit in exercise more.  4. Follow-up: 6 months  Level of Service: This visit lasted in excess of 40 minutes. More than 50% of the visit was devoted to counseling.  Sherrlyn Hock, MD 11/29/2013 9:21 AM

## 2013-11-29 NOTE — Patient Instructions (Signed)
Follow up in 6 months 

## 2014-02-01 ENCOUNTER — Other Ambulatory Visit: Payer: Self-pay | Admitting: *Deleted

## 2014-02-01 DIAGNOSIS — E038 Other specified hypothyroidism: Secondary | ICD-10-CM

## 2014-02-02 LAB — T4, FREE: FREE T4: 1.07 ng/dL (ref 0.80–1.80)

## 2014-02-02 LAB — T3, FREE: T3, Free: 2.5 pg/mL (ref 2.3–4.2)

## 2014-02-02 LAB — TSH: TSH: 1.015 u[IU]/mL (ref 0.350–4.500)

## 2014-02-06 ENCOUNTER — Telehealth: Payer: Self-pay | Admitting: "Endocrinology

## 2014-02-13 NOTE — Telephone Encounter (Signed)
Pt called back again requesting lab results.  She can be reached @ [phone]. Cameron Sprang

## 2014-03-13 ENCOUNTER — Telehealth: Payer: Self-pay | Admitting: *Deleted

## 2014-03-20 NOTE — Telephone Encounter (Signed)
Handled by nurse. Emily M Hull °

## 2014-03-21 ENCOUNTER — Other Ambulatory Visit: Payer: Self-pay | Admitting: "Endocrinology

## 2014-03-22 ENCOUNTER — Encounter: Payer: Self-pay | Admitting: Internal Medicine

## 2014-03-22 ENCOUNTER — Ambulatory Visit (INDEPENDENT_AMBULATORY_CARE_PROVIDER_SITE_OTHER): Payer: 59 | Admitting: Internal Medicine

## 2014-03-22 VITALS — BP 113/66 | HR 65 | Temp 98.1°F | Resp 18 | Wt 155.0 lb

## 2014-03-22 DIAGNOSIS — R5383 Other fatigue: Secondary | ICD-10-CM

## 2014-03-22 DIAGNOSIS — F4321 Adjustment disorder with depressed mood: Secondary | ICD-10-CM | POA: Insufficient documentation

## 2014-03-22 DIAGNOSIS — R1013 Epigastric pain: Secondary | ICD-10-CM

## 2014-03-22 DIAGNOSIS — R5381 Other malaise: Secondary | ICD-10-CM

## 2014-03-22 DIAGNOSIS — E063 Autoimmune thyroiditis: Secondary | ICD-10-CM

## 2014-03-22 DIAGNOSIS — K3189 Other diseases of stomach and duodenum: Secondary | ICD-10-CM

## 2014-03-22 LAB — CBC WITH DIFFERENTIAL/PLATELET
BASOS ABS: 0 10*3/uL (ref 0.0–0.1)
Basophils Relative: 0 % (ref 0–1)
Eosinophils Absolute: 0.1 10*3/uL (ref 0.0–0.7)
Eosinophils Relative: 1 % (ref 0–5)
HEMATOCRIT: 39.9 % (ref 36.0–46.0)
Hemoglobin: 13.9 g/dL (ref 12.0–15.0)
LYMPHS ABS: 2.2 10*3/uL (ref 0.7–4.0)
LYMPHS PCT: 33 % (ref 12–46)
MCH: 30.6 pg (ref 26.0–34.0)
MCHC: 34.8 g/dL (ref 30.0–36.0)
MCV: 87.9 fL (ref 78.0–100.0)
MONO ABS: 0.3 10*3/uL (ref 0.1–1.0)
Monocytes Relative: 5 % (ref 3–12)
Neutro Abs: 4.1 10*3/uL (ref 1.7–7.7)
Neutrophils Relative %: 61 % (ref 43–77)
Platelets: 319 10*3/uL (ref 150–400)
RBC: 4.54 MIL/uL (ref 3.87–5.11)
RDW: 13.5 % (ref 11.5–15.5)
WBC: 6.8 10*3/uL (ref 4.0–10.5)

## 2014-03-22 LAB — T3, FREE: T3, Free: 2.9 pg/mL (ref 2.3–4.2)

## 2014-03-22 LAB — T4, FREE: FREE T4: 1.38 ng/dL (ref 0.80–1.80)

## 2014-03-22 MED ORDER — BUPROPION HCL 75 MG PO TABS: 75.0000 mg | ORAL_TABLET | Freq: Two times a day (BID) | ORAL | Status: DC

## 2014-03-22 NOTE — Progress Notes (Signed)
Subjective:    Patient ID: [name], female    DOB: 1966-07-24, 48 y.o.   MRN: [id number]  HPI  [name] is here for acute visit.  PMH hypothyroidism, autoimmune thyroiditis, GERD,  Hyperlipidemia.     She reports she is extremely fatigued.  Cannot concentrate,  Gaining weight,  Sleeping all the time,   No motivation.    Very stressful last several months with her Daughter [name].   She has seen Dr [name] her endocrinologist who has adjsuted her thyroid to normal levels.  She reads naturopathic online info. and wonders if something else hormonal is going on.  She has seen Dr. Tobe [name]  She is compete note  She did see a therpaist [name] one time but has not been back due to her daughter's illness.   [name] a BZP when she is acutely stressed, but [name] not like anti-depressants as she has a fear of gaining weight.    She really [name] not like RX meds .  Her mother is a lot of her support but she has had recent knee surgery and has been unable to help.   Her spouse is away frequently with executive duties for the [employer]  Allergies  Allergen Reactions  . Biaxin [Clarithromycin]   . Clarithromycin Diarrhea    Chest pain  . Codeine Diarrhea    Chest pain  . Doxycycline Diarrhea    Chest pain  . Levofloxacin     diarrhea  . Penicillins Diarrhea    Chest pain  . Zetia [Ezetimibe] Other (See Comments)    Abdominal pain  . Tape Rash    Sunburn like rash   Past Medical History  Diagnosis Date  . Thyroid disease     Hypothyroid  . Elevated cholesterol   . Fibroid   . Vulvodynia   . Cholecystitis chronic, acute   . Ovarian cyst, right   . GERD (gastroesophageal reflux disease)   . Constipation   . Hypothyroidism, acquired, autoimmune   . Thyroiditis, autoimmune   . Fatigue   . Vitamin D deficiency disease   . Dyspepsia   . Goiter, nontoxic, multinodular    Past Surgical History  Procedure Laterality Date  . Abdominal hysterectomy     TAH  . Temporomandibular joint surgery    . Cesarean section     History   Social History  . Marital Status: Married    Spouse Name: N/A    Number of Children: N/A  . Years of Education: N/A   Occupational History  . Not on file.   Social History Main Topics  . Smoking status: Never Smoker   . Smokeless tobacco: Never Used  . Alcohol Use: 0.6 oz/week    1 Glasses of wine per week     Comment: occasional  . Drug Use: No  . Sexual Activity: Yes    Birth Control/ Protection: Surgical   Other Topics Concern  . Not on file   Social History Narrative  . No narrative on file   Family History  Problem Relation Age of Onset  . Thyroid disease Mother   . Thyroid disease Maternal Grandmother   . Thyroid disease Sister   . Diabetes Paternal Grandfather   . Cancer Neg Hx    Patient Active Problem List   Diagnosis Date Noted  . Situational depression 03/22/2014  . Cough 09/06/2013  . Hyperlipidemia 09/06/2013  . Fibroadenoma of right breast 05/26/2012  . Chronic pelvic pain  in female 12/23/2011  . Hypothyroidism, acquired, autoimmune   . Thyroiditis, autoimmune   . Goiter   . Fatigue   . Vitamin D deficiency disease   . Dyspepsia   . Goiter, nontoxic, multinodular   . Anxiety 09/25/2011  . Ovarian cyst, right   . Constipation   . Acute cholecystitis 07/16/2011  . Thyroid disease   . Elevated cholesterol   . Fibroid   . Vulvodynia   . Pure hypercholesterolemia 03/12/2011   Current Outpatient Prescriptions on File Prior to Visit  Medication Sig Dispense Refill  . Calcium Carbonate-Vitamin D (CALCIUM + D PO) [name] by mouth 2 (two) times daily.        Marland Kitchen omega-3 acid ethyl esters (LOVAZA) 1 G capsule [name] 2 CAPSULES BY MOUTH TWICE DAILY  360 capsule  0  . Probiotic Product (PROBIOTIC PO) [name] 1 tablet by mouth daily.        Marland Kitchen SYNTHROID 25 MCG tablet [name] 2 TABLETS ON WEDNESDAYS AND SUNDAYS, THEN [name] 1 TABLET PER DAY THE REMAINING 5 DAYS OF THE WEEK  35 tablet  0  .  ALPRAZolam (XANAX) 0.5 MG tablet [name] one tablet twice a day as needed for sleep or anxiety  30 tablet  1  . meloxicam (MOBIC) 15 MG tablet [name] 15 mg by mouth daily.      . Multiple Vitamin (MULTIVITAMIN) tablet [name] 1 tablet by mouth daily.         No current facility-administered medications on file prior to visit.       Review of Systems  See HPI    Objective:   Physical Exam Physical Exam  Nursing note and vitals reviewed.  Constitutional: She is oriented to person, place, and time. She appears well-developed and well-nourished.  HENT:  Head: Normocephalic and atraumatic.  Cardiovascular: Normal rate and regular rhythm. Exam reveals no gallop and no friction rub.  No murmur heard.  Pulmonary/Chest: Breath sounds normal. She has no wheezes. She has no rales.  Neurological: She is alert and oriented to person, place, and time.  Skin: Skin is warm and dry.  Psychiatric: She has a normal mood and affect. Her behavior is normal.       Assessment & Plan:  Situational stress:  I will check [name]'s FSH and estradiol but I counseled that HT is too risky for an overwhelming fatigue.  I do  Think that the chronic stress in [name]'s life is taking it's toll.  Long discussion about the nature of this.  She is agreeable to start on a very low dose of Wellbutrin 75 mg to avoid GI and sexual side effects.  I advised to re-establish more frequent visits with her therapist [name].    Hyperlipidemia  Will check today  Insomnia  Likely multifactorial.  She [name] snore quite a bit and I will discuss with her possible eval for sleep apnea at our next visit.  GERD   Continue Nexium

## 2014-03-23 LAB — COMPREHENSIVE METABOLIC PANEL
ALT: 12 U/L (ref 0–35)
AST: 14 U/L (ref 0–37)
Albumin: 4 g/dL (ref 3.5–5.2)
Alkaline Phosphatase: 49 U/L (ref 39–117)
BUN: 12 mg/dL (ref 6–23)
CO2: 21 mEq/L (ref 19–32)
Calcium: 8.9 mg/dL (ref 8.4–10.5)
Chloride: 105 mEq/L (ref 96–112)
Creat: 0.62 mg/dL (ref 0.50–1.10)
Glucose, Bld: 83 mg/dL (ref 70–99)
POTASSIUM: 4.6 meq/L (ref 3.5–5.3)
Sodium: 138 mEq/L (ref 135–145)
Total Bilirubin: 0.4 mg/dL (ref 0.2–1.2)
Total Protein: 6 g/dL (ref 6.0–8.3)

## 2014-03-23 LAB — LIPID PANEL
Cholesterol: 163 mg/dL (ref 0–200)
HDL: 52 mg/dL
LDL Cholesterol: 99 mg/dL (ref 0–99)
Total CHOL/HDL Ratio: 3.1 ratio
Triglycerides: 58 mg/dL
VLDL: 12 mg/dL (ref 0–40)

## 2014-03-23 LAB — FOLLICLE STIMULATING HORMONE: FSH: 9.6 m[IU]/mL

## 2014-03-23 NOTE — Telephone Encounter (Signed)
error 

## 2014-03-24 ENCOUNTER — Encounter: Payer: Self-pay | Admitting: Internal Medicine

## 2014-03-30 LAB — ESTRADIOL, FREE
Estradiol, Free: 1.03 pg/mL
Estradiol: 95 pg/mL

## 2014-04-03 ENCOUNTER — Encounter: Payer: Self-pay | Admitting: Internal Medicine

## 2014-04-26 ENCOUNTER — Other Ambulatory Visit: Payer: Self-pay | Admitting: "Endocrinology

## 2014-05-02 ENCOUNTER — Ambulatory Visit: Payer: 59 | Admitting: Internal Medicine

## 2014-05-22 ENCOUNTER — Other Ambulatory Visit: Payer: Self-pay | Admitting: "Endocrinology

## 2014-05-31 ENCOUNTER — Ambulatory Visit: Payer: 59 | Admitting: "Endocrinology

## 2014-06-15 ENCOUNTER — Other Ambulatory Visit: Payer: Self-pay | Admitting: "Endocrinology

## 2014-07-03 ENCOUNTER — Encounter: Payer: Self-pay | Admitting: *Deleted

## 2014-08-07 ENCOUNTER — Ambulatory Visit: Payer: 59 | Admitting: "Endocrinology

## 2014-08-30 ENCOUNTER — Ambulatory Visit: Payer: 59 | Admitting: "Endocrinology

## 2014-09-18 ENCOUNTER — Encounter: Payer: Self-pay | Admitting: Internal Medicine

## 2015-03-29 ENCOUNTER — Other Ambulatory Visit: Payer: Self-pay | Admitting: Internal Medicine

## 2015-03-29 DIAGNOSIS — E060 Acute thyroiditis: Secondary | ICD-10-CM

## 2015-03-30 ENCOUNTER — Ambulatory Visit
Admission: RE | Admit: 2015-03-30 | Discharge: 2015-03-30 | Disposition: A | Discharge status: Home / Self Care | Payer: 59 | Source: Ambulatory Visit | Attending: Internal Medicine | Admitting: Internal Medicine

## 2015-03-30 DIAGNOSIS — E060 Acute thyroiditis: Secondary | ICD-10-CM

## 2015-04-17 ENCOUNTER — Other Ambulatory Visit: Payer: Self-pay | Admitting: Otolaryngology

## 2015-04-17 DIAGNOSIS — E041 Nontoxic single thyroid nodule: Secondary | ICD-10-CM

## 2015-04-20 ENCOUNTER — Other Ambulatory Visit: Payer: Self-pay | Admitting: Otolaryngology

## 2015-04-20 DIAGNOSIS — E041 Nontoxic single thyroid nodule: Secondary | ICD-10-CM

## 2015-05-09 ENCOUNTER — Other Ambulatory Visit (HOSPITAL_COMMUNITY)
Admission: RE | Admit: 2015-05-09 | Discharge: 2015-05-09 | Disposition: A | Discharge status: Home / Self Care | Payer: 59 | Source: Ambulatory Visit | Attending: Interventional Radiology | Admitting: Interventional Radiology

## 2015-05-09 ENCOUNTER — Ambulatory Visit
Admission: RE | Admit: 2015-05-09 | Discharge: 2015-05-09 | Disposition: A | Discharge status: Home / Self Care | Payer: 59 | Source: Ambulatory Visit | Attending: Otolaryngology | Admitting: Otolaryngology

## 2015-05-09 DIAGNOSIS — E041 Nontoxic single thyroid nodule: Secondary | ICD-10-CM | POA: Insufficient documentation

## 2016-07-25 IMAGING — US US THYROID BIOPSY
1 series · 3 of 3 positions shown · non-contrast
Comparison: None.

CLINICAL DATA: Two enlarging dominant right-sided thyroid nodules.

EXAM:
ULTRASOUND GUIDED NEEDLE ASPIRATE BIOPSY OF THE THYROID GLAND

[Series 1: us thyroid biopsy · 0.08mm/px · 3 of 3 slices shown]
[im 1/3]
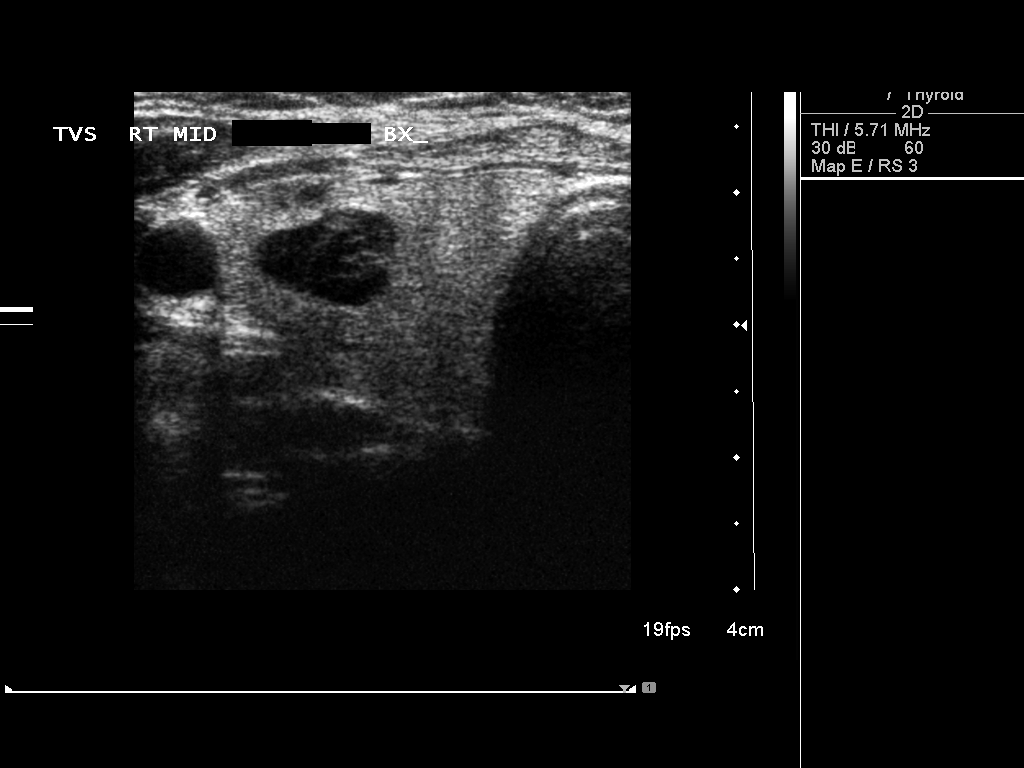
[im 2/3]
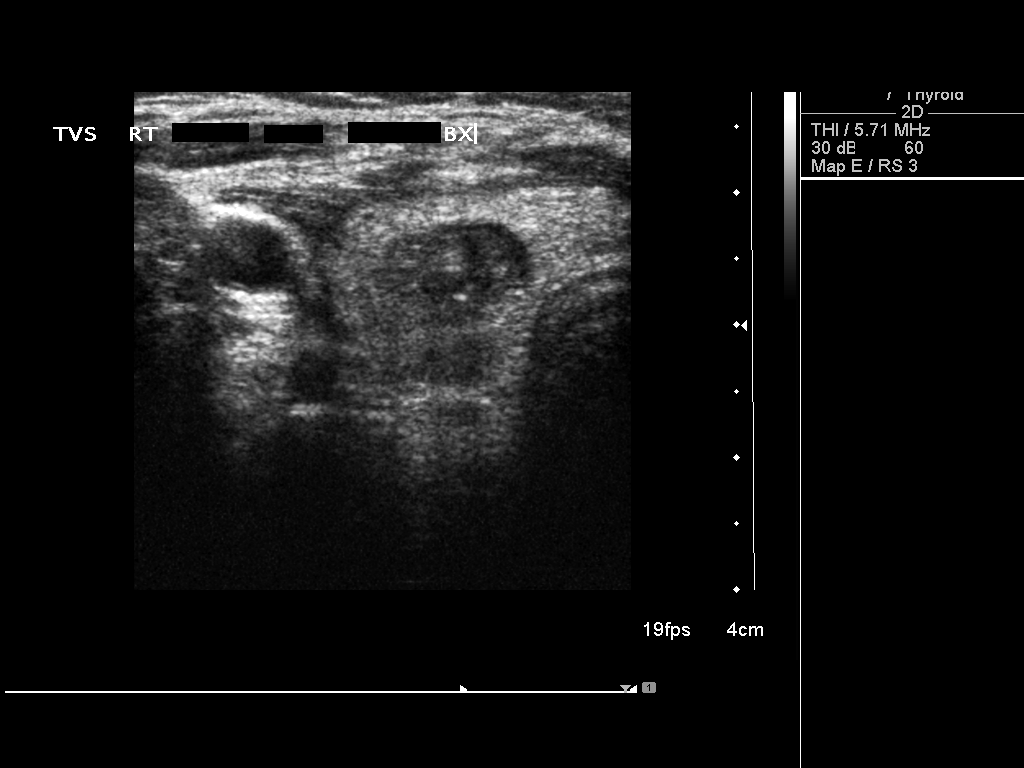
[im 3/3]
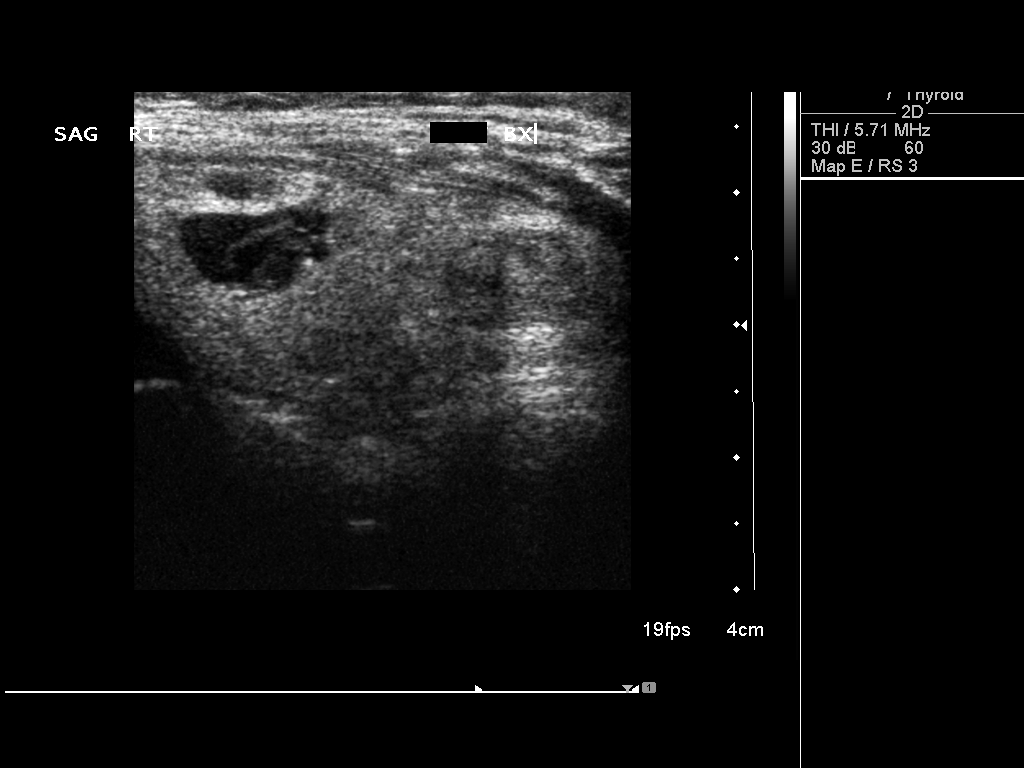

[3 of 3 positions shown; findings below may reference images not displayed]

PROCEDURE:
Thyroid biopsy was thoroughly discussed with the patient and
questions were answered. The benefits, risks, alternatives, and
complications were also discussed. The patient understands and
wishes to proceed with the procedure. Written consent was obtained.

Ultrasound was performed to localize and mark an adequate site for
the biopsy. The patient was then prepped and draped in a normal
sterile fashion. Local anesthesia was provided with 1% lidocaine.
Using direct ultrasound guidance, 5 passes were made using needles
into the nodule within the right lobe of the thyroid. Subsequently,
5 passes were made using needles into the additional adjacent nodule
within the right lobe of the thyroid gland. Ultrasound was used to
confirm needle placements on all occasions. Specimens were sent to
Pathology for analysis.

COMPLICATIONS:
None.
FINDINGS: Images document needle placement in the 2 dominant right sided
thyroid nodules.
IMPRESSION: Ultrasound guided needle aspirate biopsy performed of the 2 dominant
right thyroid nodules.

## 2017-06-08 ENCOUNTER — Other Ambulatory Visit: Payer: Self-pay | Admitting: Internal Medicine

## 2017-06-08 DIAGNOSIS — R590 Localized enlarged lymph nodes: Secondary | ICD-10-CM

## 2017-06-12 ENCOUNTER — Ambulatory Visit
Admission: RE | Admit: 2017-06-12 | Discharge: 2017-06-12 | Disposition: A | Discharge status: Home / Self Care | Payer: 59 | Source: Ambulatory Visit | Attending: Internal Medicine | Admitting: Internal Medicine

## 2017-06-12 DIAGNOSIS — R590 Localized enlarged lymph nodes: Secondary | ICD-10-CM

## 2017-07-29 ENCOUNTER — Other Ambulatory Visit: Payer: Self-pay | Admitting: Orthopedic Surgery

## 2017-07-29 DIAGNOSIS — M549 Dorsalgia, unspecified: Secondary | ICD-10-CM

## 2017-08-14 ENCOUNTER — Ambulatory Visit
Admission: RE | Admit: 2017-08-14 | Discharge: 2017-08-14 | Disposition: A | Discharge status: Home / Self Care | Payer: 59 | Source: Ambulatory Visit | Attending: Orthopedic Surgery | Admitting: Orthopedic Surgery

## 2017-08-14 DIAGNOSIS — M549 Dorsalgia, unspecified: Secondary | ICD-10-CM

## 2017-09-02 ENCOUNTER — Other Ambulatory Visit: Payer: Self-pay | Admitting: Internal Medicine

## 2017-09-02 DIAGNOSIS — N281 Cyst of kidney, acquired: Secondary | ICD-10-CM

## 2017-09-10 ENCOUNTER — Ambulatory Visit
Admission: RE | Admit: 2017-09-10 | Discharge: 2017-09-10 | Disposition: A | Discharge status: Home / Self Care | Payer: 59 | Source: Ambulatory Visit | Attending: Internal Medicine | Admitting: Internal Medicine

## 2017-09-10 DIAGNOSIS — N281 Cyst of kidney, acquired: Secondary | ICD-10-CM

## 2017-09-22 ENCOUNTER — Other Ambulatory Visit: Payer: Self-pay | Admitting: Internal Medicine

## 2017-09-22 DIAGNOSIS — R9389 Abnormal findings on diagnostic imaging of other specified body structures: Secondary | ICD-10-CM

## 2017-10-01 ENCOUNTER — Ambulatory Visit
Admission: RE | Admit: 2017-10-01 | Discharge: 2017-10-01 | Disposition: A | Discharge status: Home / Self Care | Payer: 59 | Source: Ambulatory Visit | Attending: Internal Medicine | Admitting: Internal Medicine

## 2017-10-01 DIAGNOSIS — R9389 Abnormal findings on diagnostic imaging of other specified body structures: Secondary | ICD-10-CM

## 2017-10-02 ENCOUNTER — Other Ambulatory Visit: Payer: Self-pay | Admitting: Internal Medicine

## 2017-10-02 DIAGNOSIS — R1900 Intra-abdominal and pelvic swelling, mass and lump, unspecified site: Secondary | ICD-10-CM

## 2017-10-14 ENCOUNTER — Ambulatory Visit
Admission: RE | Admit: 2017-10-14 | Discharge: 2017-10-14 | Disposition: A | Discharge status: Home / Self Care | Payer: 59 | Source: Ambulatory Visit | Attending: Internal Medicine | Admitting: Internal Medicine

## 2017-10-14 DIAGNOSIS — R1900 Intra-abdominal and pelvic swelling, mass and lump, unspecified site: Secondary | ICD-10-CM

## 2017-10-14 MED ORDER — GADOBENATE DIMEGLUMINE 529 MG/ML IV SOLN: 12.0000 mL | Freq: Once | INTRAVENOUS | Status: DC | PRN

## 2018-02-02 ENCOUNTER — Telehealth: Payer: Self-pay | Admitting: *Deleted

## 2018-02-02 NOTE — Telephone Encounter (Signed)
Called patient and gave the appt for [facility] 25th t 3:15pm.   Called and left Andera at [facility] a message with the appt date/time

## 2018-02-08 ENCOUNTER — Encounter: Payer: Self-pay | Admitting: Gynecology

## 2018-02-08 ENCOUNTER — Inpatient Hospital Stay: Payer: 59 | Attending: Gynecology | Admitting: Gynecology

## 2018-02-08 VITALS — BP 117/61 | HR 58 | Temp 99.0°F | Resp 20 | Ht 61.5 in | Wt 141.7 lb

## 2018-02-08 DIAGNOSIS — N83299 Other ovarian cyst, unspecified side: Secondary | ICD-10-CM

## 2018-02-08 DIAGNOSIS — N83201 Unspecified ovarian cyst, right side: Secondary | ICD-10-CM

## 2018-02-08 DIAGNOSIS — Z90710 Acquired absence of both cervix and uterus: Secondary | ICD-10-CM

## 2018-02-08 NOTE — Patient Instructions (Signed)
Recommendation would be for a repeat ultrasound in 6-8 weeks.  If stable or not significantly larger, Dr. Fermin Schwab would recommend repeat US every three months or unless you wanted to have the ovary removed laparoscopically.  You can obtain Monistat over the counter to treat the vaginal yeast.

## 2018-02-08 NOTE — Progress Notes (Signed)
Consult Note: Gyn-Onc   [name] 52 y.o. female  Chief Complaint  Patient presents with  . Ovarian cyst, complex    Assessment : Right adnexal cystic masses.  A simple cyst has increased in size over the past 2 months.  Vaginal "burning" most likely secondary to a yeast infection.  Plan:  1.  I reviewed the patient's MRI and ultrasound reports dating back to November 2018.  On the one hand she has a relatively stable right adnexal mass measuring 5.2 x 4.5 x 3.9 cm.  In addition there is a simple cyst which has increased in size between January 2019 and most recent ultrasound of March 2019.  This now measures 5.1 x 4.8 x 4.7 cm.  The patient is relatively asymptomatic except for what she describes as pelvic "heaviness".  I indicated to the patient that the cysts appear to be benign and that on the one hand they could be followed with serial ultrasounds.  On the other hand if her symptoms warrant then I would recommend laparoscopic salpingo-oophorectomy.  Given that I believe these are benign I will return the patient to Dr. Garwin Brothers for surgery.  If the patient chooses not to have surgery then follow-up ultrasound in approximately 6-8 weeks would be reasonable.  The patient will return to Dr. cousins for continuing gynecologic care.  2 I indicated the patient I do not believe her vaginal burning was secondary to the ovarian cysts and most likely to a yeast infection related to her recent use of antibiotics.  Suggested that she obtain vaginal Monistat over-the-counter.  HPI: 52 year old white female seen in consultation at the request of Dr. Garwin Brothers regarding management of right adnexal cystic masses.  Apparently the cystic mass was initially identified with an MRI in November 2018.  The MRI was performed to evaluate a enlarged left inguinal lymph node.  On October 14, 2017 the right ovary had a simple appearing cyst in the right ex adnexa measuring 6.0 x 5.7 x 5.3 cm.  There were no internal  septations nodularity.  The left ovary was normal.  Subsequent ultrasound on December 08, 2017 showed the right adnexal mass measures 5 x 3.5 x 4.7 cm and in addition there is a simple cyst measuring 2.3 x 2.2 x 2.4 cm.  There is no increased blood flow in either cysts.  The ovary had a simple cyst measuring 1.9 x 1.3 cm.  Most recent ultrasound on January 28, 2018 revealed a complex right ovarian cyst which is stable in size measuring 5.2 x 4.5 x 3.9 cm.  However, the simple cyst is again visualized and now has increased in size to 5.1 x 4.8 x 4.7 cm.  There is no free fluid and no internal blood flow.  Patient has had Ca1 25 values obtained the most recent on January 28, 2018 was 20 units/mL.  The patient denies any family history of ovarian or breast cancer.  Patient has a past history of having abdominal hysterectomy for symptomatic uterine fibroids.  Patient also had a recent urinary tract infection treated initially with Septra subsequently with Cipro.  Patient developed vaginal "burning" associated with the use of these antibiotics.  Review of Systems:10 point review of systems is negative except as noted in interval history.   Vitals: Blood pressure 117/61, pulse (!) 58, temperature 99 F (37.2 C), temperature source Oral, resp. rate 20, height 5' 1.5" (1.562 m), weight 141 lb 11.2 oz (64.3 kg), SpO2 100 %.  Physical Exam: General :  The patient is a healthy woman in no acute distress.  HEENT: normocephalic, extraoccular movements normal; neck is supple without thyromegally  Lynphnodes: Supraclavicular and inguinal nodes not enlarged  Abdomen: Soft, non-tender, no ascites, no organomegally, no masses, no hernias  Pelvic:  EGBUS: Normal female  Vagina: Normal, no lesions  Urethra and Bladder: Normal, non-tender  Cervix: Surgically absent  Uterus: Surgically absent  Bi-manual examination: Non-tender; no adenxal masses or nodularity (I am unable to palpate the mass) Rectal: normal sphincter  tone, no masses, no blood  Lower extremities: No edema or varicosities. Normal range of motion      Allergies  Allergen Reactions  . Uribel  [Meth-Hyo-M Bl-Na Phos-Ph Sal]     Other reaction(s): Chest Pain  . Biaxin [Clarithromycin]   . Clarithromycin Diarrhea    Chest pain  . Codeine Diarrhea    Chest pain  . Doxycycline Diarrhea    Chest pain  . Levofloxacin     diarrhea  . Penicillins Diarrhea    Chest pain  . Zetia [Ezetimibe] Other (See Comments)    Abdominal pain  . Tape Rash    Sunburn like rash    Past Medical History:  Diagnosis Date  . Cholecystitis chronic, acute   . Constipation   . Dyspepsia   . Elevated cholesterol   . Fatigue   . Fibroid   . GERD (gastroesophageal reflux disease)   . Goiter, nontoxic, multinodular   . Hypothyroidism, acquired, autoimmune   . Ovarian cyst, right   . Thyroid disease    Hypothyroid  . Thyroiditis, autoimmune   . Vitamin D deficiency disease   . Vulvodynia     Past Surgical History:  Procedure Laterality Date  . ABDOMINAL HYSTERECTOMY     TAH  . CESAREAN SECTION    . TEMPOROMANDIBULAR JOINT SURGERY      Current Outpatient Medications  Medication Sig Dispense Refill  . ARMOUR THYROID 30 MG tablet TK 1 T PO QD  5  . aspirin EC 81 MG tablet Take 81 mg by mouth daily.    . B Complex-Biotin-FA (B COMPLETE) TABS Take 1 tablet by mouth daily.    . Cholecalciferol (VITAMIN D3) 5000 units CAPS Take 1 capsule by mouth daily.    . ciprofloxacin (CIPRO) 500 MG tablet ciprofloxacin 500 mg tablet    . Coenzyme Q10 200 MG capsule Take 200 mg by mouth daily.    Marland Kitchen esomeprazole (NEXIUM) 20 MG packet Take 20 mg by mouth daily before breakfast.    . estrogens, conjugated, (PREMARIN) 0.45 MG tablet Take 0.45 mg by mouth 2 (two) times a week. Take daily for 21 days then do not take for 7 days.    . magnesium oxide (MAG-OX) 400 MG tablet Take 400 mg by mouth every evening.    . meloxicam (MOBIC) 15 MG tablet Take 15 mg by mouth  daily.    . Multiple Vitamin (MULTIVITAMIN) tablet Take 1 tablet by mouth daily.      . NON FORMULARY Estriol HRT Cream 0.5 mg/1 ml  Apply 1 ml at bedtime    . Omega-3 Fatty Acids (ULTRA OMEGA 3 PO) Take 2 tablets by mouth daily.    Marland Kitchen OVER THE COUNTER MEDICATION Hydro eyes 2 tabs in the pm    . OVER THE COUNTER MEDICATION Triphala- 2 pills in the pm    . OVER THE COUNTER MEDICATION New Tranquility 2 tabs every evening.    Marland Kitchen OVER THE COUNTER MEDICATION L-Theanine 2 capsules daily.    Marland Kitchen  OVER THE COUNTER MEDICATION Adapted- All 2 tabs daily.    Marland Kitchen OVER THE COUNTER MEDICATION Dim Plus  2 tabs daily    . Probiotic Product (PROBIOTIC PO) Take 1 tablet by mouth daily.      . Progesterone Micronized (PROGESTERONE PO) Take 25 mg by mouth every evening.     No current facility-administered medications for this visit.     Social History   Socioeconomic History  . Marital status: Married    Spouse name: Not on file  . Number of children: Not on file  . Years of education: Not on file  . Highest education level: Not on file  Occupational History  . Not on file  Social Needs  . Financial resource strain: Not on file  . Food insecurity:    Worry: Not on file    Inability: Not on file  . Transportation needs:    Medical: Not on file    Non-medical: Not on file  Tobacco Use  . Smoking status: Never Smoker  . Smokeless tobacco: Never Used  Substance and Sexual Activity  . Alcohol use: Yes    Alcohol/week: 0.6 oz    Types: 1 Glasses of wine per week    Comment: occasional  . Drug use: No  . Sexual activity: Yes    Birth control/protection: Surgical  Lifestyle  . Physical activity:    Days per week: Not on file    Minutes per session: Not on file  . Stress: Not on file  Relationships  . Social connections:    Talks on phone: Not on file    Gets together: Not on file    Attends religious service: Not on file    Active member of club or organization: Not on file    Attends meetings  of clubs or organizations: Not on file    Relationship status: Not on file  . Intimate partner violence:    Fear of current or ex partner: Not on file    Emotionally abused: Not on file    Physically abused: Not on file    Forced sexual activity: Not on file  Other Topics Concern  . Not on file  Social History Narrative  . Not on file    Family History  Problem Relation Age of Onset  . Thyroid disease Mother   . Thyroid disease Maternal Grandmother   . Thyroid disease Sister   . Diabetes Paternal Grandfather   . Cancer Neg Hx       Marti Sleigh, MD 02/08/2018, 4:12 PM

## 2018-08-30 ENCOUNTER — Other Ambulatory Visit: Payer: Self-pay | Admitting: Physician Assistant

## 2018-08-30 DIAGNOSIS — R140 Abdominal distension (gaseous): Secondary | ICD-10-CM

## 2018-08-30 DIAGNOSIS — R1013 Epigastric pain: Secondary | ICD-10-CM

## 2018-08-30 DIAGNOSIS — R110 Nausea: Secondary | ICD-10-CM

## 2018-08-30 DIAGNOSIS — K824 Cholesterolosis of gallbladder: Secondary | ICD-10-CM

## 2018-09-03 ENCOUNTER — Ambulatory Visit
Admission: RE | Admit: 2018-09-03 | Discharge: 2018-09-03 | Disposition: A | Discharge status: Home / Self Care | Payer: 59 | Source: Ambulatory Visit | Attending: Physician Assistant | Admitting: Physician Assistant

## 2018-09-03 DIAGNOSIS — R110 Nausea: Secondary | ICD-10-CM

## 2018-09-03 DIAGNOSIS — R140 Abdominal distension (gaseous): Secondary | ICD-10-CM

## 2018-09-03 DIAGNOSIS — K824 Cholesterolosis of gallbladder: Secondary | ICD-10-CM

## 2018-09-03 DIAGNOSIS — R1013 Epigastric pain: Secondary | ICD-10-CM

## 2018-09-08 ENCOUNTER — Other Ambulatory Visit: Payer: Self-pay | Admitting: Physician Assistant

## 2018-09-08 ENCOUNTER — Other Ambulatory Visit (HOSPITAL_COMMUNITY): Payer: Self-pay | Admitting: Physician Assistant

## 2018-09-08 DIAGNOSIS — R110 Nausea: Secondary | ICD-10-CM

## 2018-09-08 DIAGNOSIS — R1013 Epigastric pain: Secondary | ICD-10-CM

## 2018-09-15 ENCOUNTER — Other Ambulatory Visit: Payer: Self-pay | Admitting: Physician Assistant

## 2018-09-15 ENCOUNTER — Encounter (HOSPITAL_COMMUNITY)
Admission: RE | Admit: 2018-09-15 | Discharge: 2018-09-15 | Disposition: A | Discharge status: Home / Self Care | Payer: 59 | Source: Ambulatory Visit | Attending: Physician Assistant | Admitting: Physician Assistant

## 2018-09-15 DIAGNOSIS — R110 Nausea: Secondary | ICD-10-CM

## 2018-09-15 DIAGNOSIS — R1013 Epigastric pain: Secondary | ICD-10-CM | POA: Diagnosis present

## 2018-09-15 MED ORDER — TECHNETIUM TC 99M MEBROFENIN IV KIT
5.3000 | PACK | Freq: Once | INTRAVENOUS | Status: AC | PRN
  Administered 2018-09-15: 5.3 via INTRAVENOUS

## 2018-09-16 ENCOUNTER — Ambulatory Visit
Admission: RE | Admit: 2018-09-16 | Discharge: 2018-09-16 | Disposition: A | Discharge status: Home / Self Care | Payer: 59 | Source: Ambulatory Visit | Attending: Physician Assistant | Admitting: Physician Assistant

## 2018-09-16 DIAGNOSIS — R110 Nausea: Secondary | ICD-10-CM

## 2018-09-16 DIAGNOSIS — R1013 Epigastric pain: Secondary | ICD-10-CM

## 2018-09-16 MED ORDER — IOPAMIDOL (ISOVUE-300) INJECTION 61%
100.0000 mL | Freq: Once | INTRAVENOUS | Status: AC | PRN
  Administered 2018-09-16: 100 mL via INTRAVENOUS

## 2018-09-29 ENCOUNTER — Other Ambulatory Visit: Payer: Self-pay | Admitting: Gastroenterology

## 2018-09-29 DIAGNOSIS — R9389 Abnormal findings on diagnostic imaging of other specified body structures: Secondary | ICD-10-CM

## 2018-10-12 ENCOUNTER — Ambulatory Visit
Admission: RE | Admit: 2018-10-12 | Discharge: 2018-10-12 | Disposition: A | Discharge status: Home / Self Care | Payer: 59 | Source: Ambulatory Visit | Attending: Gastroenterology | Admitting: Gastroenterology

## 2018-10-12 DIAGNOSIS — R9389 Abnormal findings on diagnostic imaging of other specified body structures: Secondary | ICD-10-CM

## 2018-10-12 MED ORDER — GADOBENATE DIMEGLUMINE 529 MG/ML IV SOLN
13.0000 mL | Freq: Once | INTRAVENOUS | Status: AC | PRN
  Administered 2018-10-12: 13 mL via INTRAVENOUS

## 2019-06-17 ENCOUNTER — Ambulatory Visit: Payer: 59 | Admitting: Physical Therapy

## 2019-06-24 ENCOUNTER — Ambulatory Visit: Payer: 59 | Admitting: Physical Therapy

## 2019-06-27 ENCOUNTER — Ambulatory Visit: Payer: 59 | Attending: Obstetrics & Gynecology | Admitting: Physical Therapy

## 2019-06-27 ENCOUNTER — Encounter: Payer: Self-pay | Admitting: Physical Therapy

## 2019-06-27 ENCOUNTER — Other Ambulatory Visit: Payer: Self-pay

## 2019-06-27 DIAGNOSIS — R252 Cramp and spasm: Secondary | ICD-10-CM | POA: Diagnosis present

## 2019-06-27 DIAGNOSIS — M6281 Muscle weakness (generalized): Secondary | ICD-10-CM | POA: Diagnosis not present

## 2019-06-27 DIAGNOSIS — R102 Pelvic and perineal pain: Secondary | ICD-10-CM

## 2019-06-27 NOTE — Therapy (Signed)
[facility] [address], [place], [place], [address] Phone: [phone]   Fax:  [phone]  Physical Therapy Evaluation  Patient Details  Name: [name] MRN: [id number] Date of Birth: May 16, 1966 Referring Provider (PT): Dr. Lindell Noe   Encounter Date: 06/27/2019  PT End of Session - 06/27/19 1251    Visit Number  1    Date for PT Re-Evaluation  09/19/19    Authorization Type  [employer]    Authorization Time Period  10/17/2018-09/17/2019    Authorization - Visit Number  1    Authorization - Number of Visits  23    PT Start Time  1115    PT Stop Time  1200    PT Time Calculation (min)  45 min    Activity Tolerance  Patient tolerated treatment well;No increased pain    Behavior During Therapy  WFL for tasks assessed/performed       Past Medical History:  Diagnosis Date  . Cholecystitis chronic, acute   . Constipation   . Dyspepsia   . Elevated cholesterol   . Fatigue   . Fibroid   . GERD (gastroesophageal reflux disease)   . Goiter, nontoxic, multinodular   . Hypothyroidism, acquired, autoimmune   . Ovarian cyst, right   . Thyroid disease    Hypothyroid  . Thyroiditis, autoimmune   . Vitamin D deficiency disease   . Vulvodynia     Past Surgical History:  Procedure Laterality Date  . ABDOMINAL HYSTERECTOMY     TAH  . CESAREAN SECTION    . TEMPOROMANDIBULAR JOINT SURGERY      There were no vitals filed for this visit.   Subjective Assessment - 06/27/19 1123    Subjective  Patient reports 12/2017 had terrible pain and could not urinate but had to go constantly. Patient has had several UTI. Intermittent tingling in the left hip.    Patient Stated Goals  stop hurting    Currently in Pain?  Yes    Pain Score  3    high is 10/10   Pain Location  Vagina    Pain Orientation  Mid    Pain Descriptors / Indicators  Burning    Pain Type  Chronic pain    Pain Onset  More than a month ago    Pain  Frequency  Intermittent    Aggravating Factors   exercise, sitting on bike, squat,    Pain Relieving Factors  ice    Multiple Pain Sites  Yes         [facility] PT Assessment - 06/27/19 0001      Assessment   Medical Diagnosis  N94.89 High-tone pelvic floor dysfunction    Referring Provider (PT)  Dr. Lindell Noe    Onset Date/Surgical Date  12/18/17    Prior Therapy  yes, MD wants her to have internal work      Precautions   Precautions  None      Restrictions   Weight Bearing Restrictions  No      Balance Screen   Has the patient fallen in the past 6 months  No    Has the patient had a decrease in activity level because of a fear of falling?   No    Is the patient reluctant to leave their home because of a fear of falling?   No      Home Film/video editor residence  Prior Function   Level of Independence  Independent    Leisure  exercise including weight lifting 6 days per week, walking      Cognition   Overall Cognitive Status  Within Functional Limits for tasks assessed      Observation/Other Assessments   Skin Integrity  c-section scar has some fascial tightness      Posture/Postural Control   Posture/Postural Control  Postural limitations    Postural Limitations  Rounded Shoulders;Forward head;Flexed trunk;Increased thoracic kyphosis      ROM / Strength   AROM / PROM / Strength  AROM;PROM;Strength      AROM   Lumbar - Right Side Bend  decreased by 25%    Lumbar - Left Side Bend  decreased by 25%      PROM   Overall PROM   Within functional limits for tasks performed      Strength   Right Hip Extension  4/5    Right Hip External Rotation   4/5    Right Hip ABduction  3+/5    Left Hip Extension  3+/5    Left Hip External Rotation  4/5    Left Hip ABduction  3+/5    Left Hip ADduction  4/5      Palpation   SI assessment   sacrum rotated left, right ilium rotated anteriorly    Palpation comment  decreased movement of the  lower rib cage, tightness in the diaphragm, tenderness located along lumbar spinous process                Objective measurements completed on examination: See above findings.    Pelvic Floor Special Questions - 06/27/19 0001    Prior Pregnancies  Yes    Number of C-Sections  1    Number of Vaginal Deliveries  1    Currently Sexually Active  Yes    Is this Painful  No    Urinary Leakage  Yes    Activities that cause leaking  Coughing;Sneezing    Urinary urgency  Yes    Urinary frequency  sometimes every 30 minutes, gets up 1 time per night    Fecal incontinence  No    Skin Integrity  Erthema;Intact    Pelvic Floor Internal Exam  Patient confirms identification and approves PT to assess pelvic floor and treatment    Exam Type  Vaginal    Palpation  tenderness located on bil. levator ani and obturator internist    Strength  weak squeeze, no lift   TROUBLE RELAXING AFTER CONTRACTION              PT Education - 06/27/19 1251    Education Details  information on getting a wand to perform soft tissue work to the muscles internally    Person(s) Educated  Patient    Methods  Explanation;Handout    Comprehension  Verbalized understanding       PT Short Term Goals - 06/27/19 1301      PT SHORT TERM GOAL #1   Title  independent with initial HEP    Time  4    Period  Weeks    Status  New    Target Date  07/25/19      PT SHORT TERM GOAL #2   Title  pain with daily activities decreased by 25%    Time  4    Period  Weeks    Status  New    Target Date  07/25/19  PT SHORT TERM GOAL #3   Title  pelvis in correct alignment to reduce strain on the pelvic floor    Time  4    Period  Weeks    Status  New    Target Date  07/25/19      PT SHORT TERM GOAL #4   Title  understand how to use the wand to reduce the trigger points    Time  4    Period  Weeks    Status  New    Target Date  07/25/19        PT Long Term Goals - 06/27/19 1303      PT LONG TERM  GOAL #1   Title  independent with HEP and understand how to progress herself    Time  12    Period  Weeks    Status  New    Target Date  09/19/19      PT LONG TERM GOAL #2   Title  pain with daily activities decreased >/= 75%    Time  12    Period  Weeks    Status  New    Target Date  09/19/19      PT LONG TERM GOAL #3   Title  understand how to correct her posture to reduce strain on pelvic floor    Time  12    Period  Weeks    Status  New    Target Date  09/19/19      PT LONG TERM GOAL #4   Title  fatique with daily activities decreased >/= 75% due to decreased pain and increased energy    Time  12    Period  Weeks    Status  New    Target Date  09/19/19             Plan - 06/27/19 1254    Clinical Impression Statement  Patient is a 53 year old female with pelvic pain and increased tone of pelvic floor since 12/18/2017. Patient reports intermittent burning pain in the vaginal that is 3-10/10. Patient pelvic floor strength is 2/5 with difficulty relaxing the muscles after contraction. Palpable tenderness located in bilateral levator ani and obturator internist. C-section scar has fascial tightness. Bilateral hip strength averages 3+-4/10. Patient posture includes increased thoracic kyphosis, forward head and rounded shoulders. Right ilium is rotated anteriorly and sacrum rotated left. Decreased movement of the lower rib cage. Patient will benefit from skilled therapy to improve movement, reduce pain and improve overall function.    Examination-Participation Restrictions  Interpersonal Relationship;Laundry;Cleaning    Stability/Clinical Decision Making  Evolving/Moderate complexity    Clinical Decision Making  Low    Rehab Potential  Excellent    PT Frequency  2x / week    PT Duration  12 weeks    PT Treatment/Interventions  Biofeedback;Cryotherapy;Electrical Stimulation;Moist Heat;Ultrasound;Therapeutic exercise;Therapeutic activities;Neuromuscular  re-education;Patient/family education;Dry needling;Scar mobilization;Manual techniques;Taping;Spinal Manipulations    PT Next Visit Plan  educate on wand, posture, internal soft tissue work, correct pelvis    Consulted and Agree with Plan of Care  Patient       Patient will benefit from skilled therapeutic intervention in order to improve the following deficits and impairments:  Decreased range of motion, Increased fascial restricitons, Increased muscle spasms, Decreased activity tolerance, Pain, Decreased scar mobility, Decreased strength, Decreased mobility  Visit Diagnosis: 1. Muscle weakness (generalized)   2. Cramp and spasm   3. Pelvic pain  Problem List Patient Active Problem List   Diagnosis Date Noted  . Situational depression 03/22/2014  . Cough 09/06/2013  . Hyperlipidemia 09/06/2013  . Fibroadenoma of right breast 05/26/2012  . Chronic pelvic pain in female 12/23/2011  . Hypothyroidism, acquired, autoimmune   . Thyroiditis, autoimmune   . Goiter   . Fatigue   . Vitamin D deficiency disease   . Dyspepsia   . Goiter, nontoxic, multinodular   . Anxiety 09/25/2011  . Ovarian cyst, right   . Constipation   . Acute cholecystitis 07/16/2011  . Thyroid disease   . Elevated cholesterol   . Fibroid   . Vulvodynia   . Pure hypercholesterolemia 03/12/2011   Earlie Counts, PT 06/27/19 1:08 PM   [facility] [address], [place], [place], [address] Phone: [phone]   Fax:  [phone]  Name: [name] MRN: [id number] Date of Birth: [birth year]

## 2019-06-28 ENCOUNTER — Encounter: Payer: Self-pay | Admitting: Physical Therapy

## 2019-06-28 ENCOUNTER — Ambulatory Visit: Payer: 59 | Admitting: Physical Therapy

## 2019-06-28 ENCOUNTER — Other Ambulatory Visit: Payer: Self-pay

## 2019-06-28 DIAGNOSIS — R252 Cramp and spasm: Secondary | ICD-10-CM

## 2019-06-28 DIAGNOSIS — M6281 Muscle weakness (generalized): Secondary | ICD-10-CM

## 2019-06-28 DIAGNOSIS — R102 Pelvic and perineal pain: Secondary | ICD-10-CM

## 2019-06-28 NOTE — Patient Instructions (Signed)
Access Code: [email or web address]  URL: [email or web address]  Date: 06/28/2019  Prepared by: Earlie Counts   Exercises Supine Hamstring Stretch with Strap - 2 reps - 1 sets - 30 sec hold - 1x daily - 7x weekly Hip Adductors and Hamstring Stretch with Strap - 2 reps - 1 sets - 30 sec hold - 1x daily - 7x weekly Supine Pelvic Floor Stretch - 1 reps - 1 sets - 1 min hold - 1x daily - 7x weekly Prone Quadriceps Stretch with Strap - 2 reps - 1 sets - 30 sec hold - 1x daily - 7x weekly  URL: [email or web address]  Date: 06/28/2019  Prepared by: Earlie Counts   Exercises Supine Hamstring Stretch with Strap - 2 reps - 1 sets - 30 sec hold - 1x daily - 7x weekly Hip Adductors and Hamstring Stretch with Strap - 2 reps - 1 sets - 30 sec hold - 1x daily - 7x weekly Supine Pelvic Floor Stretch - 1 reps - 1 sets - 1 min hold - 1x daily - 7x weekly Prone Quadriceps Stretch with Strap - 2 reps - 1 sets - 30 sec hold - 1x daily - 7x weekly [facility] [address] [place], [place] [address] Phone # [phone] Fax [phone]

## 2019-06-28 NOTE — Therapy (Signed)
[facility] [address], [place], [place], [address] Phone: [phone]   Fax:  [phone]  Physical Therapy Treatment  Patient Details  Name: [name] MRN: [id number] Date of Birth: 06-Nov-1966 Referring Provider (PT): Dr. Lindell Noe   Encounter Date: 06/28/2019  PT End of Session - 06/28/19 0934    Visit Number  2    Date for PT Re-Evaluation  09/19/19    Authorization Type  U[employer]    Authorization Time Period  10/17/2018-09/17/2019    Authorization - Visit Number  2    Authorization - Number of Visits  23    PT Start Time  0845    PT Stop Time  0930    PT Time Calculation (min)  45 min    Activity Tolerance  Patient tolerated treatment well;No increased pain    Behavior During Therapy  WFL for tasks assessed/performed       Past Medical History:  Diagnosis Date  . Cholecystitis chronic, acute   . Constipation   . Dyspepsia   . Elevated cholesterol   . Fatigue   . Fibroid   . GERD (gastroesophageal reflux disease)   . Goiter, nontoxic, multinodular   . Hypothyroidism, acquired, autoimmune   . Ovarian cyst, right   . Thyroid disease    Hypothyroid  . Thyroiditis, autoimmune   . Vitamin D deficiency disease   . Vulvodynia     Past Surgical History:  Procedure Laterality Date  . ABDOMINAL HYSTERECTOMY     TAH  . CESAREAN SECTION    . TEMPOROMANDIBULAR JOINT SURGERY      There were no vitals filed for this visit.  Subjective Assessment - 06/28/19 0850    Subjective  I felt a more burning after therapy.  The coconut oil is is working for the dryness in the vaginal area.    Patient Stated Goals  stop hurting    Currently in Pain?  Yes    Pain Score  3    high 10/10   Pain Location  Vagina    Pain Orientation  Mid    Pain Descriptors / Indicators  Burning    Pain Type  Chronic pain    Pain Onset  More than a month ago    Pain Frequency  Intermittent    Aggravating Factors   exercise,  sitting on bike, squat    Pain Relieving Factors  ice    Multiple Pain Sites  No                    Pelvic Floor Special Questions - 06/28/19 0001    Pelvic Floor Internal Exam  Patient confirms identification and approves PT to assess pelvic floor and treatment    Exam Type  Vaginal        [facility] Adult PT Treatment/Exercise - 06/28/19 0001      Lumbar Exercises: Stretches   Active Hamstring Stretch  Right;Left;30 seconds   supine with strap   Hip Flexor Stretch  Right;Left;1 rep;30 seconds   prone with strap   Other Lumbar Stretch Exercise  happy baby stretch    Other Lumbar Stretch Exercise  hip adductor stretch with strap supine      Lumbar Exercises: Aerobic   Nustep  level 1, 5 min, seat #6, Arm #6      Lumbar Exercises: Supine   Other Supine Lumbar Exercises  lay on foam roll with alternate shoulder flexion, thoracic extension over foam  roll with breath      Manual Therapy   Manual Therapy  Joint mobilization;Soft tissue mobilization;Internal Pelvic Floor    Joint Mobilization  laying on foam roll with therapist performing rib mobilization with breath    Internal Pelvic Floor  bilateral obturator internist and levator ani to reduce trigger points             PT Education - 06/28/19 0929    Education Details  Access Code: [email or web address]    Person(s) Educated  Patient    Methods  Explanation;Demonstration;Verbal cues;Handout    Comprehension  Returned demonstration;Verbalized understanding       PT Short Term Goals - 06/27/19 1301      PT SHORT TERM GOAL #1   Title  independent with initial HEP    Time  4    Period  Weeks    Status  New    Target Date  07/25/19      PT SHORT TERM GOAL #2   Title  pain with daily activities decreased by 25%    Time  4    Period  Weeks    Status  New    Target Date  07/25/19      PT SHORT TERM GOAL #3   Title  pelvis in correct alignment to reduce strain on the pelvic floor    Time  4    Period  Weeks     Status  New    Target Date  07/25/19      PT SHORT TERM GOAL #4   Title  understand how to use the wand to reduce the trigger points    Time  4    Period  Weeks    Status  New    Target Date  07/25/19        PT Long Term Goals - 06/27/19 1303      PT LONG TERM GOAL #1   Title  independent with HEP and understand how to progress herself    Time  12    Period  Weeks    Status  New    Target Date  09/19/19      PT LONG TERM GOAL #2   Title  pain with daily activities decreased >/= 75%    Time  12    Period  Weeks    Status  New    Target Date  09/19/19      PT LONG TERM GOAL #3   Title  understand how to correct her posture to reduce strain on pelvic floor    Time  12    Period  Weeks    Status  New    Target Date  09/19/19      PT LONG TERM GOAL #4   Title  fatique with daily activities decreased >/= 75% due to decreased pain and increased energy    Time  12    Period  Weeks    Status  New    Target Date  09/19/19            Plan - 06/28/19 0935    Clinical Impression Statement  After visit patient was able to stand upright due to increased thoracic extension. Patient understands to stretch with laying on her back so she does not flex at the thoracic. Patient had reduce trigger points in the pelvic floor after therapy. Patient will benefit from skilled therapy to improve movement, reduce pain, and improve overall function.    Examination-Participation  Restrictions  Interpersonal Relationship;Laundry;Cleaning    Stability/Clinical Decision Making  Evolving/Moderate complexity    Rehab Potential  Excellent    PT Frequency  2x / week    PT Duration  12 weeks    PT Treatment/Interventions  Biofeedback;Cryotherapy;Electrical Stimulation;Moist Heat;Ultrasound;Therapeutic exercise;Therapeutic activities;Neuromuscular re-education;Patient/family education;Dry needling;Scar mobilization;Manual techniques;Taping;Spinal Manipulations    PT Next Visit Plan  educate on  wand, internal soft tissue work, correct pelvis; work on lumbar and thoracic fascia    PT Home Exercise Plan  Access Code: [email or web address]    Consulted and Agree with Plan of Care  Patient       Patient will benefit from skilled therapeutic intervention in order to improve the following deficits and impairments:  Decreased range of motion, Increased fascial restricitons, Increased muscle spasms, Decreased activity tolerance, Pain, Decreased scar mobility, Decreased strength, Decreased mobility  Visit Diagnosis: 1. Muscle weakness (generalized)   2. Cramp and spasm   3. Pelvic pain        Problem List Patient Active Problem List   Diagnosis Date Noted  . Situational depression 03/22/2014  . Cough 09/06/2013  . Hyperlipidemia 09/06/2013  . Fibroadenoma of right breast 05/26/2012  . Chronic pelvic pain in female 12/23/2011  . Hypothyroidism, acquired, autoimmune   . Thyroiditis, autoimmune   . Goiter   . Fatigue   . Vitamin D deficiency disease   . Dyspepsia   . Goiter, nontoxic, multinodular   . Anxiety 09/25/2011  . Ovarian cyst, right   . Constipation   . Acute cholecystitis 07/16/2011  . Thyroid disease   . Elevated cholesterol   . Fibroid   . Vulvodynia   . Pure hypercholesterolemia 03/12/2011    Earlie Counts, PT 06/28/19 9:38 AM   [facility] [address], [place], [place], [address] Phone: [phone]   Fax:  [phone]  Name: [name] MRN: [id number] Date of Birth: 24-Jan-1966

## 2019-07-04 ENCOUNTER — Encounter: Payer: Self-pay | Admitting: Physical Therapy

## 2019-07-04 ENCOUNTER — Ambulatory Visit: Payer: 59 | Admitting: Physical Therapy

## 2019-07-04 ENCOUNTER — Other Ambulatory Visit: Payer: Self-pay

## 2019-07-04 DIAGNOSIS — M6281 Muscle weakness (generalized): Secondary | ICD-10-CM

## 2019-07-04 DIAGNOSIS — R252 Cramp and spasm: Secondary | ICD-10-CM

## 2019-07-04 DIAGNOSIS — R102 Pelvic and perineal pain: Secondary | ICD-10-CM

## 2019-07-04 NOTE — Therapy (Signed)
[facility] [address], [place], [place], [address] Phone: [phone]   Fax:  [phone]  Physical Therapy Treatment  Patient Details  Name: [name] MRN: [id number] Date of Birth: 1966-09-24 Referring Provider (PT): Dr. Lindell Noe   Encounter Date: 07/04/2019  PT End of Session - 07/04/19 0941    Visit Number  3    Date for PT Re-Evaluation  09/19/19    Authorization Type  UHC    Authorization Time Period  10/17/2018-09/17/2019    Authorization - Visit Number  3    Authorization - Number of Visits  23    PT Start Time  0900    PT Stop Time  0940    PT Time Calculation (min)  40 min    Activity Tolerance  Patient tolerated treatment well;No increased pain    Behavior During Therapy  WFL for tasks assessed/performed       Past Medical History:  Diagnosis Date  . Cholecystitis chronic, acute   . Constipation   . Dyspepsia   . Elevated cholesterol   . Fatigue   . Fibroid   . GERD (gastroesophageal reflux disease)   . Goiter, nontoxic, multinodular   . Hypothyroidism, acquired, autoimmune   . Ovarian cyst, right   . Thyroid disease    Hypothyroid  . Thyroiditis, autoimmune   . Vitamin D deficiency disease   . Vulvodynia     Past Surgical History:  Procedure Laterality Date  . ABDOMINAL HYSTERECTOMY     TAH  . CESAREAN SECTION    . TEMPOROMANDIBULAR JOINT SURGERY      There were no vitals filed for this visit.  Subjective Assessment - 07/04/19 0902    Subjective  I feel worse with the burning  since therapy started. I have more of a pressure feeling. I have the tools that I ordered for the vagina.  I felt I could elongate my spine better after therapy.    Patient Stated Goals  stop hurting    Currently in Pain?  Yes    Pain Score  4     Pain Location  Vagina    Pain Orientation  Mid    Pain Descriptors / Indicators  Burning    Pain Type  Chronic pain    Pain Onset  More than a month  ago    Pain Frequency  Intermittent    Aggravating Factors   exercise, sitting on bike, squat    Pain Relieving Factors  ice    Multiple Pain Sites  No         [facility] PT Assessment - 07/04/19 0001      Observation/Other Assessments   Observations  height 5 feet 2 inches                Pelvic Floor Special Questions - 07/04/19 0001    Pelvic Floor Internal Exam  Patient confirms identification and approves PT to assess pelvic floor and treatment    Exam Type  Vaginal        [facility] Adult PT Treatment/Exercise - 07/04/19 0001      Self-Care   Self-Care  Other Self-Care Comments    Other Self-Care Comments   instructed patient on how to use the wand to massage the pelvic floor muscles, using lubricant and not coconut oil, how to mauever the wand      Lumbar Exercises: Aerobic   Nustep  level 4, 5 min, seat #6, Arm #6  Manual Therapy   Manual Therapy  Internal Pelvic Floor    Internal Pelvic Floor  bilateral obturator internist and levator ani to reduce trigger points             PT Education - 07/04/19 0940    Education Details  how to use the pelvic floor wand to work on the pelvic floor muscles    Person(s) Educated  Patient    Methods  Explanation;Demonstration    Comprehension  Verbalized understanding;Returned demonstration       PT Short Term Goals - 07/04/19 0908      PT SHORT TERM GOAL #1   Title  independent with initial HEP    Time  4    Period  Weeks    Status  On-going    Target Date  07/25/19      PT SHORT TERM GOAL #2   Title  pain with daily activities decreased by 25%    Time  4    Period  Weeks    Status  On-going    Target Date  07/25/19      PT SHORT TERM GOAL #3   Title  pelvis in correct alignment to reduce strain on the pelvic floor    Time  4    Period  Weeks    Status  On-going    Target Date  07/25/19      PT SHORT TERM GOAL #4   Title  understand how to use the wand to reduce the trigger points    Time  4     Period  Weeks    Status  On-going    Target Date  07/25/19        PT Long Term Goals - 06/27/19 1303      PT LONG TERM GOAL #1   Title  independent with HEP and understand how to progress herself    Time  12    Period  Weeks    Status  New    Target Date  09/19/19      PT LONG TERM GOAL #2   Title  pain with daily activities decreased >/= 75%    Time  12    Period  Weeks    Status  New    Target Date  09/19/19      PT LONG TERM GOAL #3   Title  understand how to correct her posture to reduce strain on pelvic floor    Time  12    Period  Weeks    Status  New    Target Date  09/19/19      PT LONG TERM GOAL #4   Title  fatique with daily activities decreased >/= 75% due to decreased pain and increased energy    Time  12    Period  Weeks    Status  New    Target Date  09/19/19            Plan - 07/04/19 0943    Clinical Impression Statement  Patient was educated on how to use her pelvic floor wand to reduce the trigger points in the muscles at home. Patient continues to have elongation of the thoracic spine and has been using her foam roll. Patient pelvic floor muscles were softer than last visit. Patient will benefit from skilled therpay to improve movement, reduce pain, and improve overall function.    Examination-Participation Restrictions  Interpersonal Relationship;Laundry;Cleaning    Stability/Clinical Decision Making  Evolving/Moderate complexity  Rehab Potential  Excellent    PT Frequency  2x / week    PT Duration  12 weeks    PT Treatment/Interventions  Biofeedback;Cryotherapy;Electrical Stimulation;Moist Heat;Ultrasound;Therapeutic exercise;Therapeutic activities;Neuromuscular re-education;Patient/family education;Dry needling;Scar mobilization;Manual techniques;Taping;Spinal Manipulations    PT Next Visit Plan  internal soft tissue work, correct pelvis; work on lumbar and thoracic fascia    PT Home Exercise Plan  Access Code: [email or web address]    Recommended  Other Services  MD signed initial eval    Consulted and Agree with Plan of Care  Patient       Patient will benefit from skilled therapeutic intervention in order to improve the following deficits and impairments:  Decreased range of motion, Increased fascial restricitons, Increased muscle spasms, Decreased activity tolerance, Pain, Decreased scar mobility, Decreased strength, Decreased mobility  Visit Diagnosis: 1. Muscle weakness (generalized)   2. Cramp and spasm   3. Pelvic pain        Problem List Patient Active Problem List   Diagnosis Date Noted  . Situational depression 03/22/2014  . Cough 09/06/2013  . Hyperlipidemia 09/06/2013  . Fibroadenoma of right breast 05/26/2012  . Chronic pelvic pain in female 12/23/2011  . Hypothyroidism, acquired, autoimmune   . Thyroiditis, autoimmune   . Goiter   . Fatigue   . Vitamin D deficiency disease   . Dyspepsia   . Goiter, nontoxic, multinodular   . Anxiety 09/25/2011  . Ovarian cyst, right   . Constipation   . Acute cholecystitis 07/16/2011  . Thyroid disease   . Elevated cholesterol   . Fibroid   . Vulvodynia   . Pure hypercholesterolemia 03/12/2011    Earlie Counts, PT 07/04/19 9:47 AM   [facility] [address] [place], [place], [address] Phone: [phone]   Fax:  [phone]  Name: [name] MRN: [id number] Date of Birth: 1966/04/11

## 2019-07-08 ENCOUNTER — Encounter: Payer: Self-pay | Admitting: Physical Therapy

## 2019-07-08 ENCOUNTER — Ambulatory Visit: Payer: 59 | Admitting: Physical Therapy

## 2019-07-08 ENCOUNTER — Other Ambulatory Visit: Payer: Self-pay

## 2019-07-08 DIAGNOSIS — M6281 Muscle weakness (generalized): Secondary | ICD-10-CM | POA: Diagnosis not present

## 2019-07-08 DIAGNOSIS — R252 Cramp and spasm: Secondary | ICD-10-CM

## 2019-07-08 DIAGNOSIS — R102 Pelvic and perineal pain: Secondary | ICD-10-CM

## 2019-07-08 NOTE — Therapy (Signed)
[facility] [address] [place], [place], [address] Phone: [phone]   Fax:  [phone]  Physical Therapy Treatment  Patient Details  Name: [name] MRN: [id number] Date of Birth: [birth year] Referring Provider (PT): Dr. Lindell Noe   Encounter Date: 07/08/2019  PT End of Session - 07/08/19 0928    Visit Number  4    Date for PT Re-Evaluation  09/19/19    Authorization Type  UHC    Authorization Time Period  10/17/2018-09/17/2019    Authorization - Visit Number  4    Authorization - Number of Visits  23    PT Start Time  0845    PT Stop Time  0925    PT Time Calculation (min)  40 min    Activity Tolerance  Patient tolerated treatment well;No increased pain    Behavior During Therapy  WFL for tasks assessed/performed       Past Medical History:  Diagnosis Date  . Cholecystitis chronic, acute   . Constipation   . Dyspepsia   . Elevated cholesterol   . Fatigue   . Fibroid   . GERD (gastroesophageal reflux disease)   . Goiter, nontoxic, multinodular   . Hypothyroidism, acquired, autoimmune   . Ovarian cyst, right   . Thyroid disease    Hypothyroid  . Thyroiditis, autoimmune   . Vitamin D deficiency disease   . Vulvodynia     Past Surgical History:  Procedure Laterality Date  . ABDOMINAL HYSTERECTOMY     TAH  . CESAREAN SECTION    . TEMPOROMANDIBULAR JOINT SURGERY      There were no vitals filed for this visit.  Subjective Assessment - 07/08/19 0852    Subjective  I am not so great today. My hips hurt at night.    Patient Stated Goals  stop hurting    Currently in Pain?  Yes    Pain Score  5     Pain Location  Vagina    Pain Orientation  Mid    Pain Descriptors / Indicators  Burning    Pain Type  Chronic pain    Pain Onset  More than a month ago    Aggravating Factors   exercise, sitting on bike, squat    Pain Relieving Factors  ice    Multiple Pain Sites  No         [facility] PT  Assessment - 07/08/19 0001      Assessment   Medical Diagnosis  N94.89 High-tone pelvic floor dysfunction    Referring Provider (PT)  Dr. Lindell Noe      Palpation   SI assessment   sacrum rotated left, right ilium rotated anteriorly                Pelvic Floor Special Questions - 07/08/19 0001    Pelvic Floor Internal Exam  Patient confirms identification and approves PT to assess pelvic floor and treatment    Exam Type  Vaginal        [facility] Adult PT Treatment/Exercise - 07/08/19 0001      Self-Care   Self-Care  Other Self-Care Comments    Other Self-Care Comments   instructed patient to stretch her hips at night prior to bed.       Exercises   Exercises  Other Exercises    Other Exercises   how to engage the abdominals correctly with her workouts, not pulling her neck or flexing at the thoracic spine  with abdominal exercise      Lumbar Exercises: Aerobic   Nustep  level 4, 5 min, seat #6, Arm #6      Manual Therapy   Manual Therapy  Joint mobilization;Muscle Energy Technique;Internal Pelvic Floor    Joint Mobilization  T2-T12 grad 3 P-A mobilization; sacral mobilization;     Internal Pelvic Floor  bilateral obturator internist and levator ani to reduce trigger points    Muscle Energy Technique  to correct right ilium             PT Education - 07/08/19 0928    Education Details  education on correct engagement of the abdominals    Person(s) Educated  Patient    Methods  Explanation;Demonstration    Comprehension  Verbalized understanding;Returned demonstration       PT Short Term Goals - 07/08/19 0853      PT SHORT TERM GOAL #1   Title  independent with initial HEP    Time  4    Period  Weeks    Status  Achieved    Target Date  07/25/19      PT SHORT TERM GOAL #2   Title  pain with daily activities decreased by 25%    Time  4    Period  Weeks    Status  On-going    Target Date  07/25/19      PT SHORT TERM GOAL #3   Title  pelvis in  correct alignment to reduce strain on the pelvic floor    Time  4    Period  Weeks    Status  On-going    Target Date  07/25/19      PT SHORT TERM GOAL #4   Title  understand how to use the wand to reduce the trigger points    Time  4    Period  Weeks    Status  On-going    Target Date  07/25/19        PT Long Term Goals - 06/27/19 1303      PT LONG TERM GOAL #1   Title  independent with HEP and understand how to progress herself    Time  12    Period  Weeks    Status  New    Target Date  09/19/19      PT LONG TERM GOAL #2   Title  pain with daily activities decreased >/= 75%    Time  12    Period  Weeks    Status  New    Target Date  09/19/19      PT LONG TERM GOAL #3   Title  understand how to correct her posture to reduce strain on pelvic floor    Time  12    Period  Weeks    Status  New    Target Date  09/19/19      PT LONG TERM GOAL #4   Title  fatique with daily activities decreased >/= 75% due to decreased pain and increased energy    Time  12    Period  Weeks    Status  New    Target Date  09/19/19            Plan - 07/08/19 0850    Clinical Impression Statement  Patient is having increased burning of the pelvic floor today. Patient posture is increased flexion at thoracic. Patient has difficulty with engaging her abdominals and will flex at the  thoracic with abodminal work putting a strain on the thoracic. Patient had several trigger points in the pelvic floor with right more than left. Patient pelvic in correct position. Patient has not achieved goals today. Patient will benefit from skilled therapy to improve movement, reduce pain and improve overall function.    Examination-Participation Restrictions  Interpersonal Relationship;Laundry;Cleaning    Stability/Clinical Decision Making  Evolving/Moderate complexity    Rehab Potential  Excellent    PT Frequency  2x / week    PT Duration  12 weeks    PT Treatment/Interventions   Biofeedback;Cryotherapy;Electrical Stimulation;Moist Heat;Ultrasound;Therapeutic exercise;Therapeutic activities;Neuromuscular re-education;Patient/family education;Dry needling;Scar mobilization;Manual techniques;Taping;Spinal Manipulations    PT Next Visit Plan  internal soft tissue work, correct pelvis; work on lumbar and thoracic fascia; work on posture with exercise and correct abdominal contraction    PT Home Exercise Plan  Access Code: [email or web address]    Consulted and Agree with Plan of Care  Patient       Patient will benefit from skilled therapeutic intervention in order to improve the following deficits and impairments:  Decreased range of motion, Increased fascial restricitons, Increased muscle spasms, Decreased activity tolerance, Pain, Decreased scar mobility, Decreased strength, Decreased mobility  Visit Diagnosis: Muscle weakness (generalized)  Cramp and spasm  Pelvic pain     Problem List Patient Active Problem List   Diagnosis Date Noted  . Situational depression 03/22/2014  . Cough 09/06/2013  . Hyperlipidemia 09/06/2013  . Fibroadenoma of right breast 05/26/2012  . Chronic pelvic pain in female 12/23/2011  . Hypothyroidism, acquired, autoimmune   . Thyroiditis, autoimmune   . Goiter   . Fatigue   . Vitamin D deficiency disease   . Dyspepsia   . Goiter, nontoxic, multinodular   . Anxiety 09/25/2011  . Ovarian cyst, right   . Constipation   . Acute cholecystitis 07/16/2011  . Thyroid disease   . Elevated cholesterol   . Fibroid   . Vulvodynia   . Pure hypercholesterolemia 03/12/2011    Earlie Counts, PT 07/08/19 9:32 AM   [facility] [address] [place], [place], [address] Phone: [phone]   Fax:  [phone]  Name: [name] MRN: [id number] Date of Birth: 11/14/1966

## 2019-07-11 ENCOUNTER — Encounter: Payer: Self-pay | Admitting: Physical Therapy

## 2019-07-11 ENCOUNTER — Ambulatory Visit: Payer: 59 | Admitting: Physical Therapy

## 2019-07-11 ENCOUNTER — Other Ambulatory Visit: Payer: Self-pay

## 2019-07-11 DIAGNOSIS — R102 Pelvic and perineal pain: Secondary | ICD-10-CM

## 2019-07-11 DIAGNOSIS — R252 Cramp and spasm: Secondary | ICD-10-CM

## 2019-07-11 DIAGNOSIS — M6281 Muscle weakness (generalized): Secondary | ICD-10-CM

## 2019-07-11 NOTE — Therapy (Signed)
[facility] [address], [place], [place], [address] Phone: [phone]   Fax:  [phone]  Physical Therapy Treatment  Patient Details  Name: [name] MRN: [id number] Date of Birth: 1966/02/13 Referring Provider (PT): Dr. Lindell Noe   Encounter Date: 07/11/2019  PT End of Session - 07/11/19 0858    Visit Number  4    Date for PT Re-Evaluation  09/19/19    Authorization Type  U[employer]    Authorization Time Period  10/17/2018-09/17/2019    Authorization - Visit Number  5    Authorization - Number of Visits  23    PT Start Time  0900    PT Stop Time  0940    PT Time Calculation (min)  40 min    Activity Tolerance  Patient tolerated treatment well;No increased pain    Behavior During Therapy  WFL for tasks assessed/performed       Past Medical History:  Diagnosis Date  . Cholecystitis chronic, acute   . Constipation   . Dyspepsia   . Elevated cholesterol   . Fatigue   . Fibroid   . GERD (gastroesophageal reflux disease)   . Goiter, nontoxic, multinodular   . Hypothyroidism, acquired, autoimmune   . Ovarian cyst, right   . Thyroid disease    Hypothyroid  . Thyroiditis, autoimmune   . Vitamin D deficiency disease   . Vulvodynia     Past Surgical History:  Procedure Laterality Date  . ABDOMINAL HYSTERECTOMY     TAH  . CESAREAN SECTION    . TEMPOROMANDIBULAR JOINT SURGERY      There were no vitals filed for this visit.  Subjective Assessment - 07/11/19 0901    Subjective  I have been working on my posture. The burining is worse. I have been icing.    Patient Stated Goals  stop hurting    Currently in Pain?  Yes    Pain Score  6     Pain Location  Vagina    Pain Orientation  Mid    Pain Descriptors / Indicators  Burning    Pain Type  Chronic pain    Pain Onset  More than a month ago    Pain Frequency  Intermittent    Aggravating Factors   exercise, sitting on bike, squat    Pain Relieving  Factors  ice    Multiple Pain Sites  No         [facility] PT Assessment - 07/11/19 0001      Assessment   Medical Diagnosis  N94.89 High-tone pelvic floor dysfunction    Referring Provider (PT)  Dr. Lindell Noe    Onset Date/Surgical Date  12/18/17      Cognition   Overall Cognitive Status  Within Functional Limits for tasks assessed                Pelvic Floor Special Questions - 07/11/19 0001    Pelvic Floor Internal Exam  Patient confirms identification and approves PT to assess pelvic floor and treatment    Exam Type  Vaginal        [facility] Adult PT Treatment/Exercise - 07/11/19 0001      Neuro Re-ed    Neuro Re-ed Details   instruction on how to bulge the pelvic floor using a mirror, upright posture with pressing into her feet and contract the lower abdominals      Lumbar Exercises: Supine   Other Supine Lumbar Exercises  laying on foam roll to decompress the spine      Manual Therapy   Manual Therapy  Joint mobilization;Muscle Energy Technique;Internal Pelvic Floor;Soft tissue mobilization    Joint Mobilization  T2-T12 grad 3 P-A mobilization; sacral mobilization;     Soft tissue mobilization  ot the vulva area and ishiocavernosus and bulbocavernosus to release the tissue    Internal Pelvic Floor  bilateral obturator internist and levator ani to reduce trigger points    Muscle Energy Technique  to correct right ilium             PT Education - 07/11/19 0942    Education Details  how to bulge the pelvic floor and perform soft tissue work with the wand daily    Person(s) Educated  Patient    Methods  Explanation;Demonstration    Comprehension  Verbalized understanding;Returned demonstration       PT Short Term Goals - 07/11/19 0945      PT SHORT TERM GOAL #4   Title  understand how to use the wand to reduce the trigger points    Time  4    Period  Weeks    Status  Achieved        PT Long Term Goals - 06/27/19 1303      PT LONG TERM GOAL  #1   Title  independent with HEP and understand how to progress herself    Time  12    Period  Weeks    Status  New    Target Date  09/19/19      PT LONG TERM GOAL #2   Title  pain with daily activities decreased >/= 75%    Time  12    Period  Weeks    Status  New    Target Date  09/19/19      PT LONG TERM GOAL #3   Title  understand how to correct her posture to reduce strain on pelvic floor    Time  12    Period  Weeks    Status  New    Target Date  09/19/19      PT LONG TERM GOAL #4   Title  fatique with daily activities decreased >/= 75% due to decreased pain and increased energy    Time  12    Period  Weeks    Status  New    Target Date  09/19/19            Plan - 07/11/19 0858    Clinical Impression Statement  Patient is able to stand up straighter. Patient has less burning after therapy. Patient continues to have more trigger points in the pelvic floor with left more than right. Patient has talked to her trainer to focus on the lower abdominals, hip abductors and looking at her technique. Patient will benefit from skilled therapy to improve movement, reduce pain and improve overall function.    Examination-Participation Restrictions  Interpersonal Relationship;Laundry;Cleaning    Stability/Clinical Decision Making  Evolving/Moderate complexity    Rehab Potential  Excellent    PT Frequency  2x / week    PT Duration  12 weeks    PT Treatment/Interventions  Biofeedback;Cryotherapy;Electrical Stimulation;Moist Heat;Ultrasound;Therapeutic exercise;Therapeutic activities;Neuromuscular re-education;Patient/family education;Dry needling;Scar mobilization;Manual techniques;Taping;Spinal Manipulations    PT Next Visit Plan  internal soft tissue work, correct pelvis; work on lumbar and thoracic fascia; work on posture with exercise and correct abdominal contraction    PT Home Exercise Plan  Access Code: [email or web address]  Consulted and Agree with Plan of Care  Patient        Patient will benefit from skilled therapeutic intervention in order to improve the following deficits and impairments:  Decreased range of motion, Increased fascial restricitons, Increased muscle spasms, Decreased activity tolerance, Pain, Decreased scar mobility, Decreased strength, Decreased mobility  Visit Diagnosis: Muscle weakness (generalized)  Cramp and spasm  Pelvic pain     Problem List Patient Active Problem List   Diagnosis Date Noted  . Situational depression 03/22/2014  . Cough 09/06/2013  . Hyperlipidemia 09/06/2013  . Fibroadenoma of right breast 05/26/2012  . Chronic pelvic pain in female 12/23/2011  . Hypothyroidism, acquired, autoimmune   . Thyroiditis, autoimmune   . Goiter   . Fatigue   . Vitamin D deficiency disease   . Dyspepsia   . Goiter, nontoxic, multinodular   . Anxiety 09/25/2011  . Ovarian cyst, right   . Constipation   . Acute cholecystitis 07/16/2011  . Thyroid disease   . Elevated cholesterol   . Fibroid   . Vulvodynia   . Pure hypercholesterolemia 03/12/2011    Earlie Counts, PT 07/11/19 9:46 AM   [facility] [address], [place], [place], [address] Phone: [phone]   Fax:  [phone]  Name: [name] MRN: [id number] Date of Birth: [birth year]

## 2019-07-15 ENCOUNTER — Encounter: Payer: Self-pay | Admitting: Physical Therapy

## 2019-07-15 ENCOUNTER — Other Ambulatory Visit: Payer: Self-pay

## 2019-07-15 ENCOUNTER — Ambulatory Visit: Payer: 59 | Admitting: Physical Therapy

## 2019-07-15 DIAGNOSIS — M6281 Muscle weakness (generalized): Secondary | ICD-10-CM

## 2019-07-15 DIAGNOSIS — R102 Pelvic and perineal pain: Secondary | ICD-10-CM

## 2019-07-15 DIAGNOSIS — R252 Cramp and spasm: Secondary | ICD-10-CM

## 2019-07-15 NOTE — Therapy (Signed)
[facility] [address] [place], [place], [address] Phone: [phone]   Fax:  [phone]  Physical Therapy Treatment  Patient Details  Name: [name] MRN: [id number] Date of Birth: 04-May-1966 Referring Provider (PT): Dr. Lindell Noe   Encounter Date: 07/15/2019  PT End of Session - 07/15/19 0939    Visit Number  5    Date for PT Re-Evaluation  09/19/19    Authorization Type  UHC    Authorization Time Period  10/17/2018-09/17/2019    Authorization - Visit Number  6    Authorization - Number of Visits  23    PT Start Time  0930    PT Stop Time  1010    PT Time Calculation (min)  40 min    Activity Tolerance  Patient tolerated treatment well;No increased pain    Behavior During Therapy  WFL for tasks assessed/performed       Past Medical History:  Diagnosis Date  . Cholecystitis chronic, acute   . Constipation   . Dyspepsia   . Elevated cholesterol   . Fatigue   . Fibroid   . GERD (gastroesophageal reflux disease)   . Goiter, nontoxic, multinodular   . Hypothyroidism, acquired, autoimmune   . Ovarian cyst, right   . Thyroid disease    Hypothyroid  . Thyroiditis, autoimmune   . Vitamin D deficiency disease   . Vulvodynia     Past Surgical History:  Procedure Laterality Date  . ABDOMINAL HYSTERECTOMY     TAH  . CESAREAN SECTION    . TEMPOROMANDIBULAR JOINT SURGERY      There were no vitals filed for this visit.  Subjective Assessment - 07/15/19 0935    Subjective  I have worked with my trainer to perform exercises correctly. I have been more aware of my posture and correcting it. The burning is intermittent this week.    Patient Stated Goals  stop hurting    Currently in Pain?  Yes    Pain Score  6     Pain Location  Vagina    Pain Orientation  Mid    Pain Descriptors / Indicators  Burning    Pain Type  Chronic pain    Pain Onset  More than a month ago    Pain Frequency   Intermittent    Aggravating Factors   exercise, sitting on bike, squat    Pain Relieving Factors  ice    Multiple Pain Sites  No                    Pelvic Floor Special Questions - 07/15/19 0001    Pelvic Floor Internal Exam  Patient confirms identification and approves PT to assess pelvic floor and treatment    Exam Type  Vaginal        [facility] Adult PT Treatment/Exercise - 07/15/19 0001      Lumbar Exercises: Stretches   Active Hamstring Stretch  Right;Left;2 reps;30 seconds   foam roller   ITB Stretch  Right;Left;1 rep;30 seconds   foam roll   Piriformis Stretch  Right;Left;1 rep;30 seconds   foam roll     Lumbar Exercises: Supine   Other Supine Lumbar Exercises  laying on foam roll to decompress the spine, butterfly stretch,       Manual Therapy   Manual Therapy  Internal Pelvic Floor    Internal Pelvic Floor  bilateral obturator internist and levator ani to reduce trigger points; release  around the urethra sphincter and bladder with one hand on the outside of bladder             PT Education - 07/15/19 1017    Education Details  Access Code: [email or web address]    Person(s) Educated  Patient    Methods  Explanation;Demonstration;Verbal cues;Handout    Comprehension  Verbalized understanding;Returned demonstration       PT Short Term Goals - 07/11/19 0945      PT SHORT TERM GOAL #4   Title  understand how to use the wand to reduce the trigger points    Time  4    Period  Weeks    Status  Achieved        PT Long Term Goals - 06/27/19 1303      PT LONG TERM GOAL #1   Title  independent with HEP and understand how to progress herself    Time  12    Period  Weeks    Status  New    Target Date  09/19/19      PT LONG TERM GOAL #2   Title  pain with daily activities decreased >/= 75%    Time  12    Period  Weeks    Status  New    Target Date  09/19/19      PT LONG TERM GOAL #3   Title  understand how to correct her posture to reduce strain on  pelvic floor    Time  12    Period  Weeks    Status  New    Target Date  09/19/19      PT LONG TERM GOAL #4   Title  fatique with daily activities decreased >/= 75% due to decreased pain and increased energy    Time  12    Period  Weeks    Status  New    Target Date  09/19/19            Plan - 07/15/19 [id number]    Clinical Impression Statement  Patient pain is more intermittent than constant and intensity is staying at 5/10. Patient is more aware of her posture and having her trainer work on her form. Patient has less tightness in the pelvic floor. Patient has fascial restrictions suprapubically. Patient pelvic floor strength is 2/5. Patient will benefit from skilled therapy to improve movement, reduce pain and improve overall function.    Examination-Participation Restrictions  Interpersonal Relationship;Laundry;Cleaning    Stability/Clinical Decision Making  Evolving/Moderate complexity    Rehab Potential  Excellent    PT Frequency  2x / week    PT Duration  12 weeks    PT Treatment/Interventions  Biofeedback;Cryotherapy;Electrical Stimulation;Moist Heat;Ultrasound;Therapeutic exercise;Therapeutic activities;Neuromuscular re-education;Patient/family education;Dry needling;Scar mobilization;Manual techniques;Taping;Spinal Manipulations    PT Next Visit Plan  internal soft tissue work, correct pelvis; work on lumbar and thoracic fascia; work on posture with exercise and correct abdominal contraction    PT Home Exercise Plan  Access Code: [email or web address]    Consulted and Agree with Plan of Care  Patient       Patient will benefit from skilled therapeutic intervention in order to improve the following deficits and impairments:  Decreased range of motion, Increased fascial restricitons, Increased muscle spasms, Decreased activity tolerance, Pain, Decreased scar mobility, Decreased strength, Decreased mobility  Visit Diagnosis: Muscle weakness (generalized)  Cramp and spasm  Pelvic  pain     Problem List Patient Active Problem List   Diagnosis Date Noted  .  Situational depression 03/22/2014  . Cough 09/06/2013  . Hyperlipidemia 09/06/2013  . Fibroadenoma of right breast 05/26/2012  . Chronic pelvic pain in female 12/23/2011  . Hypothyroidism, acquired, autoimmune   . Thyroiditis, autoimmune   . Goiter   . Fatigue   . Vitamin D deficiency disease   . Dyspepsia   . Goiter, nontoxic, multinodular   . Anxiety 09/25/2011  . Ovarian cyst, right   . Constipation   . Acute cholecystitis 07/16/2011  . Thyroid disease   . Elevated cholesterol   . Fibroid   . Vulvodynia   . Pure hypercholesterolemia 03/12/2011    Earlie Counts, PT 07/15/19 10:22 AM   [facility] [address] [place], [place], [address] Phone: [phone]   Fax:  [phone]  Name: [name] MRN: [id number] Date of Birth: 08/05/1966

## 2019-07-15 NOTE — Patient Instructions (Signed)
Access Code: [email or web address]  URL: [email or web address]  Date: 07/15/2019  Prepared by: Earlie Counts   Exercises Supine Hamstring Stretch with Strap - 2 reps - 1 sets - 30 sec hold - 1x daily - 7x weekly Hip Adductors and Hamstring Stretch with Strap - 2 reps - 1 sets - 30 sec hold - 1x daily - 7x weekly Supine Pelvic Floor Stretch - 1 reps - 1 sets - 1 min hold - 1x daily - 7x weekly Prone Quadriceps Stretch with Strap - 2 reps - 1 sets - 30 sec hold - 1x daily - 7x weekly Piriformis Mobilization on Foam Roll - 10 reps - 1 sets - 1x daily - 7x weekly Hip Flexor Mobilization with Foam Roll - 10 reps - 1 sets - 1x daily                            - 7x weekly Sidelying IT Band Foam Roll Mobilization - 10 reps - 1 sets - 1x daily - 7x weekly Hamstring Mobilization on Foam Roll - 10 reps - 1 sets - 1x daily - 7x weekly Supine Pelvic Floor Stretch - 1 reps - 1 sets - 1 min hold - 1x daily - 7x weekly

## 2019-07-18 ENCOUNTER — Encounter: Payer: Self-pay | Admitting: Physical Therapy

## 2019-07-18 ENCOUNTER — Ambulatory Visit: Payer: 59 | Admitting: Physical Therapy

## 2019-07-18 ENCOUNTER — Other Ambulatory Visit: Payer: Self-pay

## 2019-07-18 DIAGNOSIS — R102 Pelvic and perineal pain: Secondary | ICD-10-CM

## 2019-07-18 DIAGNOSIS — M6281 Muscle weakness (generalized): Secondary | ICD-10-CM

## 2019-07-18 DIAGNOSIS — R252 Cramp and spasm: Secondary | ICD-10-CM

## 2019-07-18 NOTE — Therapy (Signed)
[facility] [address] [place], [place], [address] Phone: [phone]   Fax:  [phone]  Physical Therapy Treatment  Patient Details  Name: [name] MRN: [id number] Date of Birth: Aug 27, 1966 Referring Provider (PT): Dr. Lindell Noe   Encounter Date: 07/18/2019  PT End of Session - 07/18/19 1027    Visit Number  6    Date for PT Re-Evaluation  09/19/19    Authorization Type  UHC    Authorization - Visit Number  7    Authorization - Number of Visits  23    PT Start Time  0945    PT Stop Time  1024    PT Time Calculation (min)  39 min    Activity Tolerance  Patient tolerated treatment well;No increased pain    Behavior During Therapy  WFL for tasks assessed/performed       Past Medical History:  Diagnosis Date  . Cholecystitis chronic, acute   . Constipation   . Dyspepsia   . Elevated cholesterol   . Fatigue   . Fibroid   . GERD (gastroesophageal reflux disease)   . Goiter, nontoxic, multinodular   . Hypothyroidism, acquired, autoimmune   . Ovarian cyst, right   . Thyroid disease    Hypothyroid  . Thyroiditis, autoimmune   . Vitamin D deficiency disease   . Vulvodynia     Past Surgical History:  Procedure Laterality Date  . ABDOMINAL HYSTERECTOMY     TAH  . CESAREAN SECTION    . TEMPOROMANDIBULAR JOINT SURGERY      There were no vitals filed for this visit.  Subjective Assessment - 07/18/19 0951    Subjective  I felt worse after the manual work but today I feel the best that I have in awhile.    Patient Stated Goals  stop hurting    Currently in Pain?  Yes    Pain Score  2     Pain Location  Vagina    Pain Orientation  Mid    Pain Descriptors / Indicators  Burning    Pain Type  Chronic pain    Pain Onset  More than a month ago    Pain Frequency  Intermittent    Aggravating Factors   exercise, sitting on bike, squat    Pain Relieving Factors  ice    Multiple Pain Sites  No                        [facility] Adult PT Treatment/Exercise - 07/18/19 0001      Lumbar Exercises: Supine   Other Supine Lumbar Exercises  laying on foam roll to decompress the spine, butterfly stretch,       Manual Therapy   Manual Therapy  Myofascial release;Soft tissue mobilization    Soft tissue mobilization  around her c-section scar and suprapubically    Muscle Energy Technique  around the bladder and pubovesical ligaments               PT Short Term Goals - 07/11/19 0945      PT SHORT TERM GOAL #4   Title  understand how to use the wand to reduce the trigger points    Time  4    Period  Weeks    Status  Achieved        PT Long Term Goals - 06/27/19 1303      PT LONG TERM GOAL #1  Title  independent with HEP and understand how to progress herself    Time  12    Period  Weeks    Status  New    Target Date  09/19/19      PT LONG TERM GOAL #2   Title  pain with daily activities decreased >/= 75%    Time  12    Period  Weeks    Status  New    Target Date  09/19/19      PT LONG TERM GOAL #3   Title  understand how to correct her posture to reduce strain on pelvic floor    Time  12    Period  Weeks    Status  New    Target Date  09/19/19      PT LONG TERM GOAL #4   Title  fatique with daily activities decreased >/= 75% due to decreased pain and increased energy    Time  12    Period  Weeks    Status  New    Target Date  09/19/19            Plan - 07/18/19 1028    Clinical Impression Statement  Patient is more aware of her posture. Patient pain level decreased to 1/10 after manual work. Patient had tightness around her c-section scar. Patient has increased thoracic extension. Patient will benefit from skilled therapy to improve movement, reduce pain, and improve overall function.    Examination-Participation Restrictions  Interpersonal Relationship;Laundry;Cleaning    Stability/Clinical Decision Making  Evolving/Moderate complexity     Rehab Potential  Excellent    PT Frequency  2x / week    PT Duration  12 weeks    PT Treatment/Interventions  Biofeedback;Cryotherapy;Electrical Stimulation;Moist Heat;Ultrasound;Therapeutic exercise;Therapeutic activities;Neuromuscular re-education;Patient/family education;Dry needling;Scar mobilization;Manual techniques;Taping;Spinal Manipulations    PT Next Visit Plan  internal soft tissue work, correct pelvis; work on lumbar and thoracic fascia; work on posture with exercise and correct abdominal contraction    PT Home Exercise Plan  Access Code: [email or web address]    Consulted and Agree with Plan of Care  Patient       Patient will benefit from skilled therapeutic intervention in order to improve the following deficits and impairments:  Decreased range of motion, Increased fascial restricitons, Increased muscle spasms, Decreased activity tolerance, Pain, Decreased scar mobility, Decreased strength, Decreased mobility  Visit Diagnosis: Muscle weakness (generalized)  Cramp and spasm  Pelvic pain     Problem List Patient Active Problem List   Diagnosis Date Noted  . Situational depression 03/22/2014  . Cough 09/06/2013  . Hyperlipidemia 09/06/2013  . Fibroadenoma of right breast 05/26/2012  . Chronic pelvic pain in female 12/23/2011  . Hypothyroidism, acquired, autoimmune   . Thyroiditis, autoimmune   . Goiter   . Fatigue   . Vitamin D deficiency disease   . Dyspepsia   . Goiter, nontoxic, multinodular   . Anxiety 09/25/2011  . Ovarian cyst, right   . Constipation   . Acute cholecystitis 07/16/2011  . Thyroid disease   . Elevated cholesterol   . Fibroid   . Vulvodynia   . Pure hypercholesterolemia 03/12/2011    Earlie Counts, PT 07/18/19 10:31 AM   [facility] [address], [place], [place], [address] Phone: [phone]   Fax:  [phone]  Name: [name] MRN: [id number] Date of Birth: 1966-02-08

## 2019-07-22 ENCOUNTER — Other Ambulatory Visit: Payer: Self-pay

## 2019-07-22 ENCOUNTER — Ambulatory Visit: Payer: 59 | Attending: Obstetrics & Gynecology | Admitting: Physical Therapy

## 2019-07-22 ENCOUNTER — Encounter: Payer: Self-pay | Admitting: Physical Therapy

## 2019-07-22 DIAGNOSIS — M6281 Muscle weakness (generalized): Secondary | ICD-10-CM | POA: Diagnosis present

## 2019-07-22 DIAGNOSIS — R102 Pelvic and perineal pain: Secondary | ICD-10-CM | POA: Insufficient documentation

## 2019-07-22 DIAGNOSIS — R252 Cramp and spasm: Secondary | ICD-10-CM | POA: Diagnosis present

## 2019-07-22 NOTE — Patient Instructions (Addendum)
Slow Contraction: Gravity Resisted (Sitting)    Sitting, slowly squeeze pelvic floor for _5__ seconds. Rest for _10__ seconds. Repeat _10__ times. Do _3__ times a day. Then end with 5 quick contractions Copyright  [employer]. All rights reserved.  [facility] [address], [place], [place] [address] Phone # [phone] Fax [phone]

## 2019-07-22 NOTE — Therapy (Signed)
[facility] [address] [place], [place], [address] Phone: [phone]   Fax:  [phone]  Physical Therapy Treatment  Patient Details  Name: [name] MRN: [id number] Date of Birth: 10/02/1966 Referring Provider (PT): Dr. Lindell Noe   Encounter Date: 07/22/2019  PT End of Session - 07/22/19 0938    Visit Number  7    Date for PT Re-Evaluation  09/19/19    Authorization Type  [employer]    Authorization Time Period  10/17/2018-09/17/2019    Authorization - Visit Number  8    Authorization - Number of Visits  23    PT Start Time  0930    PT Stop Time  1010    PT Time Calculation (min)  40 min    Activity Tolerance  Patient tolerated treatment well;No increased pain    Behavior During Therapy  WFL for tasks assessed/performed       Past Medical History:  Diagnosis Date  . Cholecystitis chronic, acute   . Constipation   . Dyspepsia   . Elevated cholesterol   . Fatigue   . Fibroid   . GERD (gastroesophageal reflux disease)   . Goiter, nontoxic, multinodular   . Hypothyroidism, acquired, autoimmune   . Ovarian cyst, right   . Thyroid disease    Hypothyroid  . Thyroiditis, autoimmune   . Vitamin D deficiency disease   . Vulvodynia     Past Surgical History:  Procedure Laterality Date  . ABDOMINAL HYSTERECTOMY     TAH  . CESAREAN SECTION    . TEMPOROMANDIBULAR JOINT SURGERY      There were no vitals filed for this visit.  Subjective Assessment - 07/22/19 0936    Subjective  I had a few little flares that are short lift. I am having less and not as long lasting flare-ups. I feel the pain is more toward the rectum and the left hip is bothering me.    Patient Stated Goals  stop hurting    Currently in Pain?  Yes    Pain Score  2     Pain Location  Vagina    Pain Orientation  Mid    Pain Descriptors / Indicators  Burning    Pain Type  Chronic pain    Pain Onset  More than a month ago    Pain  Frequency  Intermittent    Aggravating Factors   Not sure,    Pain Relieving Factors  ice    Multiple Pain Sites  No                    Pelvic Floor Special Questions - 07/22/19 0001    Pelvic Floor Internal Exam  Patient confirms identification and approves PT to assess pelvic floor and treatment    Exam Type  Vaginal        [facility] Adult PT Treatment/Exercise - 07/22/19 0001      Neuro Re-ed    Neuro Re-ed Details   with thierapist finger in the vaginal canal working on pelvic floor contraction holding for 5 seconds      Lumbar Exercises: Aerobic   Nustep  level 4, 5 min, seat #6, Arm #6   while assessing patient     Manual Therapy   Manual Therapy  Internal Pelvic Floor    Internal Pelvic Floor  release of bilateral levator ani and obturator internist with holding on trigger points for 90 seconds and outside hand on  the oustside of the muscle             PT Education - 07/22/19 1010    Education Details  pelvic floor contraction in sitting    Person(s) Educated  Patient    Methods  Explanation;Demonstration;Handout    Comprehension  Returned demonstration;Verbalized understanding       PT Short Term Goals - 07/22/19 1014      PT SHORT TERM GOAL #2   Title  pain with daily activities decreased by 25%    Time  4    Period  Weeks    Status  Achieved      PT SHORT TERM GOAL #3   Title  pelvis in correct alignment to reduce strain on the pelvic floor    Time  4    Period  Weeks    Status  Achieved      PT SHORT TERM GOAL #4   Title  understand how to use the wand to reduce the trigger points    Time  4    Period  Weeks    Status  New        PT Long Term Goals - 06/27/19 1303      PT LONG TERM GOAL #1   Title  independent with HEP and understand how to progress herself    Time  12    Period  Weeks    Status  New    Target Date  09/19/19      PT LONG TERM GOAL #2   Title  pain with daily activities decreased >/= 75%    Time  12     Period  Weeks    Status  New    Target Date  09/19/19      PT LONG TERM GOAL #3   Title  understand how to correct her posture to reduce strain on pelvic floor    Time  12    Period  Weeks    Status  New    Target Date  09/19/19      PT LONG TERM GOAL #4   Title  fatique with daily activities decreased >/= 75% due to decreased pain and increased energy    Time  12    Period  Weeks    Status  New    Target Date  09/19/19            Plan - 07/22/19 0939    Clinical Impression Statement  Patient is having less intensity fo pain and less flare-ups. Patient pelvic floor strength is 3/5 holding for 5 seconds. Patient is ready to work on pelvic floor strength. Patient pelvic floor muscles are more supple. Patient will benefit from skilled therapy to improve movement, reduce pain , and improve overall function.    Examination-Participation Restrictions  Interpersonal Relationship;Laundry;Cleaning    Stability/Clinical Decision Making  Evolving/Moderate complexity    Rehab Potential  Excellent    PT Frequency  2x / week    PT Duration  12 weeks    PT Treatment/Interventions  Biofeedback;Cryotherapy;Electrical Stimulation;Moist Heat;Ultrasound;Therapeutic exercise;Therapeutic activities;Neuromuscular re-education;Patient/family education;Dry needling;Scar mobilization;Manual techniques;Taping;Spinal Manipulations    PT Next Visit Plan  internal soft tissue work, correct pelvis; work on lumbar and thoracic fascia; work on posture with exercise and correct abdominal ; pelvic floor strength    PT Home Exercise Plan  Access Code: [email or web address]    Consulted and Agree with Plan of Care  Patient       Patient will  benefit from skilled therapeutic intervention in order to improve the following deficits and impairments:  Decreased range of motion, Increased fascial restricitons, Increased muscle spasms, Decreased activity tolerance, Pain, Decreased scar mobility, Decreased strength, Decreased  mobility  Visit Diagnosis: Muscle weakness (generalized)  Cramp and spasm  Pelvic pain     Problem List Patient Active Problem List   Diagnosis Date Noted  . Situational depression 03/22/2014  . Cough 09/06/2013  . Hyperlipidemia 09/06/2013  . Fibroadenoma of right breast 05/26/2012  . Chronic pelvic pain in female 12/23/2011  . Hypothyroidism, acquired, autoimmune   . Thyroiditis, autoimmune   . Goiter   . Fatigue   . Vitamin D deficiency disease   . Dyspepsia   . Goiter, nontoxic, multinodular   . Anxiety 09/25/2011  . Ovarian cyst, right   . Constipation   . Acute cholecystitis 07/16/2011  . Thyroid disease   . Elevated cholesterol   . Fibroid   . Vulvodynia   . Pure hypercholesterolemia 03/12/2011    Earlie Counts, PT 07/22/19 10:16 AM ' [facility] [address], [place], [place], [address] Phone: [phone]   Fax:  [phone]  Name: [name] MRN: [id number] Date of Birth: 1966-04-19

## 2019-07-26 ENCOUNTER — Ambulatory Visit: Payer: 59 | Admitting: Physical Therapy

## 2019-07-26 ENCOUNTER — Encounter: Payer: Self-pay | Admitting: Physical Therapy

## 2019-07-26 ENCOUNTER — Other Ambulatory Visit: Payer: Self-pay

## 2019-07-26 DIAGNOSIS — M6281 Muscle weakness (generalized): Secondary | ICD-10-CM | POA: Diagnosis not present

## 2019-07-26 DIAGNOSIS — R252 Cramp and spasm: Secondary | ICD-10-CM

## 2019-07-26 DIAGNOSIS — R102 Pelvic and perineal pain: Secondary | ICD-10-CM

## 2019-07-26 NOTE — Patient Instructions (Addendum)
   [facility] [address], [address] [place], [place] [address] Phone # [phone] Fax [phone]  

## 2019-07-26 NOTE — Therapy (Signed)
[facility] [address], [place], [place], [address] Phone: [phone]   Fax:  [phone]  Physical Therapy Treatment  Patient Details  Name: [name] MRN: [id number] Date of Birth: 13-Aug-1966 Referring Provider (PT): Dr. Lindell Noe   Encounter Date: 07/26/2019  PT End of Session - 07/26/19 0931    Visit Number  8    Date for PT Re-Evaluation  09/19/19    Authorization Type  UHC    Authorization Time Period  10/17/2018-09/17/2019    Authorization - Visit Number  9    Authorization - Number of Visits  23    PT Start Time  0845    PT Stop Time  0925    PT Time Calculation (min)  40 min    Activity Tolerance  Patient tolerated treatment well;No increased pain    Behavior During Therapy  WFL for tasks assessed/performed       Past Medical History:  Diagnosis Date  . Cholecystitis chronic, acute   . Constipation   . Dyspepsia   . Elevated cholesterol   . Fatigue   . Fibroid   . GERD (gastroesophageal reflux disease)   . Goiter, nontoxic, multinodular   . Hypothyroidism, acquired, autoimmune   . Ovarian cyst, right   . Thyroid disease    Hypothyroid  . Thyroiditis, autoimmune   . Vitamin D deficiency disease   . Vulvodynia     Past Surgical History:  Procedure Laterality Date  . ABDOMINAL HYSTERECTOMY     TAH  . CESAREAN SECTION    . TEMPOROMANDIBULAR JOINT SURGERY      There were no vitals filed for this visit.  Subjective Assessment - 07/26/19 0848    Subjective  Pain is intermittent. My back is very tight and feels very restricted across the middle.    Patient Stated Goals  stop hurting    Currently in Pain?  Yes    Pain Score  4     Pain Location  Vagina    Pain Orientation  Mid    Pain Descriptors / Indicators  Burning    Pain Type  Chronic pain    Pain Onset  More than a month ago    Pain Frequency  Intermittent    Aggravating Factors   Not sure    Pain Relieving Factors  ice     Multiple Pain Sites  No                    Pelvic Floor Special Questions - 07/26/19 0001    Pelvic Floor Internal Exam  Patient confirms identification and approves PT to assess pelvic floor and treatment    Exam Type  Vaginal        [facility] Adult PT Treatment/Exercise - 07/26/19 0001      Lumbar Exercises: Aerobic   Nustep  level 4, 5 min, seat #6, Arm #6   while assessing patient     Lumbar Exercises: Supine   Other Supine Lumbar Exercises  laying on foam roll to decompress the spine, butterfly stretch, then place foam roll along the thoracic spine at level T6-8 and extend spine      Lumbar Exercises: Quadruped   Other Quadruped Lumbar Exercises  thread the needle posture both ways      Manual Therapy   Manual Therapy  Soft tissue mobilization;Joint mobilization;Internal Pelvic Floor    Joint Mobilization  T5-L1 P-A and rotational mobilization grade 3; mid posterior rib  mobilization    Internal Pelvic Floor  right levator ani wiht hip movements, righ tobturator             PT Education - 07/26/19 0909    Education Details  yoga poses to stretch the thoracic spine    Methods  Explanation;Demonstration;Handout    Comprehension  Verbalized understanding;Returned demonstration       PT Short Term Goals - 07/26/19 0936      PT SHORT TERM GOAL #1   Title  independent with initial HEP    Period  Weeks    Status  Achieved    Target Date  07/25/19      PT SHORT TERM GOAL #2   Title  pain with daily activities decreased by 25%    Time  4    Period  Weeks    Status  Achieved    Target Date  07/25/19      PT SHORT TERM GOAL #3   Title  pelvis in correct alignment to reduce strain on the pelvic floor    Time  4    Period  Weeks    Status  Achieved    Target Date  07/25/19      PT SHORT TERM GOAL #4   Title  understand how to use the wand to reduce the trigger points    Time  4    Period  Weeks    Status  On-going    Target Date  07/25/19         PT Long Term Goals - 06/27/19 1303      PT LONG TERM GOAL #1   Title  independent with HEP and understand how to progress herself    Time  12    Period  Weeks    Status  New    Target Date  09/19/19      PT LONG TERM GOAL #2   Title  pain with daily activities decreased >/= 75%    Time  12    Period  Weeks    Status  New    Target Date  09/19/19      PT LONG TERM GOAL #3   Title  understand how to correct her posture to reduce strain on pelvic floor    Time  12    Period  Weeks    Status  New    Target Date  09/19/19      PT LONG TERM GOAL #4   Title  fatique with daily activities decreased >/= 75% due to decreased pain and increased energy    Time  12    Period  Weeks    Status  New    Target Date  09/19/19            Plan - 07/26/19 0931    Clinical Impression Statement  Patient has increased tightness in the mid thoracic area and worked on mobilization of the area. Patient pain decreased to 3/10 after therapy. Patient had increased tightness of the ATLA on the right. Patient had increased tight of the right levator ani. Patinet continues to have increased thoracic kyphosis which is tight. Patient will benefit from skilled therapy to improve movement, reduce pain, and improve overall function.    Examination-Participation Restrictions  Interpersonal Relationship;Laundry;Cleaning    Rehab Potential  Excellent    PT Frequency  2x / week    PT Duration  12 weeks    PT Treatment/Interventions  Biofeedback;Cryotherapy;Electrical Stimulation;Moist Heat;Ultrasound;Therapeutic exercise;Therapeutic  activities;Neuromuscular re-education;Patient/family education;Dry needling;Scar mobilization;Manual techniques;Taping;Spinal Manipulations    PT Next Visit Plan  internal soft tissue work, correct pelvis; work on lumbar and thoracic fascia; work on posture with exercise and correct abdominal ; pelvic floor strength; aquat for stretch    PT Home Exercise Plan  Access Code:  [email or web address]    Consulted and Agree with Plan of Care  Patient       Patient will benefit from skilled therapeutic intervention in order to improve the following deficits and impairments:  Decreased range of motion, Increased fascial restricitons, Increased muscle spasms, Decreased activity tolerance, Pain, Decreased scar mobility, Decreased strength, Decreased mobility  Visit Diagnosis: Muscle weakness (generalized)  Cramp and spasm  Pelvic pain     Problem List Patient Active Problem List   Diagnosis Date Noted  . Situational depression 03/22/2014  . Cough 09/06/2013  . Hyperlipidemia 09/06/2013  . Fibroadenoma of right breast 05/26/2012  . Chronic pelvic pain in female 12/23/2011  . Hypothyroidism, acquired, autoimmune   . Thyroiditis, autoimmune   . Goiter   . Fatigue   . Vitamin D deficiency disease   . Dyspepsia   . Goiter, nontoxic, multinodular   . Anxiety 09/25/2011  . Ovarian cyst, right   . Constipation   . Acute cholecystitis 07/16/2011  . Thyroid disease   . Elevated cholesterol   . Fibroid   . Vulvodynia   . Pure hypercholesterolemia 03/12/2011    Earlie Counts, PT 07/26/19 9:38 AM   [facility] [address] [place], [place], [address] Phone: [phone]   Fax:  [phone]  Name: DAICY BEUS MRN: [id number] Date of Birth: 16-Dec-1965

## 2019-07-29 ENCOUNTER — Encounter: Payer: Self-pay | Admitting: Physical Therapy

## 2019-07-29 ENCOUNTER — Other Ambulatory Visit: Payer: Self-pay

## 2019-07-29 ENCOUNTER — Ambulatory Visit: Payer: 59 | Admitting: Physical Therapy

## 2019-07-29 DIAGNOSIS — R102 Pelvic and perineal pain: Secondary | ICD-10-CM

## 2019-07-29 DIAGNOSIS — R252 Cramp and spasm: Secondary | ICD-10-CM

## 2019-07-29 DIAGNOSIS — M6281 Muscle weakness (generalized): Secondary | ICD-10-CM

## 2019-07-29 NOTE — Therapy (Signed)
[facility] [address], [place], [place], [address] Phone: [phone]   Fax:  [phone]  Physical Therapy Treatment  Patient Details  Name: [name] MRN: [id number] Date of Birth: 1966-08-13 Referring Provider (PT): Dr. Lindell Noe   Encounter Date: 07/29/2019  PT End of Session - 07/29/19 1017    Visit Number  9    Date for PT Re-Evaluation  09/19/19    Authorization Type  UHC    Authorization Time Period  10/17/2018-09/17/2019    Authorization - Visit Number  10    Authorization - Number of Visits  23    PT Start Time  0930    PT Stop Time  1010    PT Time Calculation (min)  40 min    Activity Tolerance  Patient tolerated treatment well;No increased pain    Behavior During Therapy  WFL for tasks assessed/performed       Past Medical History:  Diagnosis Date  . Cholecystitis chronic, acute   . Constipation   . Dyspepsia   . Elevated cholesterol   . Fatigue   . Fibroid   . GERD (gastroesophageal reflux disease)   . Goiter, nontoxic, multinodular   . Hypothyroidism, acquired, autoimmune   . Ovarian cyst, right   . Thyroid disease    Hypothyroid  . Thyroiditis, autoimmune   . Vitamin D deficiency disease   . Vulvodynia     Past Surgical History:  Procedure Laterality Date  . ABDOMINAL HYSTERECTOMY     TAH  . CESAREAN SECTION    . TEMPOROMANDIBULAR JOINT SURGERY      There were no vitals filed for this visit.  Subjective Assessment - 07/29/19 0936    Subjective  My back is feeling better from last session. Patient is not using her vaginal ice packs as much.    Patient Stated Goals  stop hurting    Currently in Pain?  Yes    Pain Score  3     Pain Location  Vagina    Pain Orientation  Mid    Pain Descriptors / Indicators  Burning    Pain Type  Chronic pain    Pain Onset  More than a month ago    Pain Frequency  Intermittent    Aggravating Factors   not sure    Pain Relieving  Factors  ice    Multiple Pain Sites  No                       [facility] Adult PT Treatment/Exercise - 07/29/19 0001      Self-Care   Self-Care  Other Self-Care Comments    Other Self-Care Comments   instructed on proper  bras to increase thoracic support      Therapeutic Activites    Therapeutic Activities  Other Therapeutic Activities    Other Therapeutic Activities  posture in sittng while texting,       Lumbar Exercises: Aerobic   Nustep  level 4, 5 min, seat #6, Arm #6   while assessing patient     Shoulder Exercises: Standing   Other Standing Exercises  using a belt to hold shoulders back while doing dead lifts, pull ups, tricep, shoulder extension with therapist using a strap on the hips to have her flex a hips instead of the thoracic, tactile cues to squeeze the scapula               PT Short  Term Goals - 07/26/19 0936      PT SHORT TERM GOAL #1   Title  independent with initial HEP    Period  Weeks    Status  Achieved    Target Date  07/25/19      PT SHORT TERM GOAL #2   Title  pain with daily activities decreased by 25%    Time  4    Period  Weeks    Status  Achieved    Target Date  07/25/19      PT SHORT TERM GOAL #3   Title  pelvis in correct alignment to reduce strain on the pelvic floor    Time  4    Period  Weeks    Status  Achieved    Target Date  07/25/19      PT SHORT TERM GOAL #4   Title  understand how to use the wand to reduce the trigger points    Time  4    Period  Weeks    Status  On-going    Target Date  07/25/19        PT Long Term Goals - 06/27/19 1303      PT LONG TERM GOAL #1   Title  independent with HEP and understand how to progress herself    Time  12    Period  Weeks    Status  New    Target Date  09/19/19      PT LONG TERM GOAL #2   Title  pain with daily activities decreased >/= 75%    Time  12    Period  Weeks    Status  New    Target Date  09/19/19      PT LONG TERM GOAL #3   Title   understand how to correct her posture to reduce strain on pelvic floor    Time  12    Period  Weeks    Status  New    Target Date  09/19/19      PT LONG TERM GOAL #4   Title  fatique with daily activities decreased >/= 75% due to decreased pain and increased energy    Time  12    Period  Weeks    Status  New    Target Date  09/19/19            Plan - 07/29/19 1009    Clinical Impression Statement  Patietn still wil lflex at the mid thoracic but after therapy she was able to contract the interscapular muscles correctly. Patient understands about certain exercise bras witll help increse thoracic extension for posture. Patient needs more tactile cues to contract her lower abdominals so she is not contraction her pelvic floor for stability. Patient understands how to exercise with her trainer correctly to not flex at the thoracic and work her back muscles correctly. Patient will benefit from skilled therapy to improve movement, reduce pain, and improve overall function.    Examination-Participation Restrictions  Interpersonal Relationship;Laundry;Cleaning    Stability/Clinical Decision Making  Evolving/Moderate complexity    Rehab Potential  Excellent    PT Frequency  2x / week    PT Duration  12 weeks    PT Treatment/Interventions  Biofeedback;Cryotherapy;Electrical Stimulation;Moist Heat;Ultrasound;Therapeutic exercise;Therapeutic activities;Neuromuscular re-education;Patient/family education;Dry needling;Scar mobilization;Manual techniques;Taping;Spinal Manipulations    PT Next Visit Plan  internal soft tissue work, correct pelvis; work on lumbar and thoracic fascia; work on posture with exercise and correct abdominal ; pelvic  floor strength; squat for stretch    PT Home Exercise Plan  Access Code: [email or web address]    Consulted and Agree with Plan of Care  Patient       Patient will benefit from skilled therapeutic intervention in order to improve the following deficits and impairments:   Decreased range of motion, Increased fascial restricitons, Increased muscle spasms, Decreased activity tolerance, Pain, Decreased scar mobility, Decreased strength, Decreased mobility  Visit Diagnosis: Muscle weakness (generalized)  Cramp and spasm  Pelvic pain     Problem List Patient Active Problem List   Diagnosis Date Noted  . Situational depression 03/22/2014  . Cough 09/06/2013  . Hyperlipidemia 09/06/2013  . Fibroadenoma of right breast 05/26/2012  . Chronic pelvic pain in female 12/23/2011  . Hypothyroidism, acquired, autoimmune   . Thyroiditis, autoimmune   . Goiter   . Fatigue   . Vitamin D deficiency disease   . Dyspepsia   . Goiter, nontoxic, multinodular   . Anxiety 09/25/2011  . Ovarian cyst, right   . Constipation   . Acute cholecystitis 07/16/2011  . Thyroid disease   . Elevated cholesterol   . Fibroid   . Vulvodynia   . Pure hypercholesterolemia 03/12/2011    Earlie Counts, PT 07/29/19 10:17 AM    [facility] [address], [place], [place], [address] Phone: [phone]   Fax:  [phone]  Name: [name] MRN: [id number] Date of Birth: [birth year]

## 2019-08-03 ENCOUNTER — Ambulatory Visit: Payer: 59 | Admitting: Physical Therapy

## 2019-08-03 ENCOUNTER — Encounter: Payer: Self-pay | Admitting: Physical Therapy

## 2019-08-03 ENCOUNTER — Other Ambulatory Visit: Payer: Self-pay

## 2019-08-03 DIAGNOSIS — M6281 Muscle weakness (generalized): Secondary | ICD-10-CM | POA: Diagnosis not present

## 2019-08-03 DIAGNOSIS — R102 Pelvic and perineal pain: Secondary | ICD-10-CM

## 2019-08-03 DIAGNOSIS — R252 Cramp and spasm: Secondary | ICD-10-CM

## 2019-08-03 NOTE — Therapy (Signed)
[facility] [address], [place], [place], [address] Phone: [phone]   Fax:  [phone]  Physical Therapy Treatment  Patient Details  Name: [name] MRN: [id number] Date of Birth: [birth year] Referring Provider (PT): Dr. Lindell Noe   Encounter Date: 08/03/2019  PT End of Session - 08/03/19 0820    Visit Number  10    Date for PT Re-Evaluation  09/19/19    Authorization Type  [employer]    Authorization Time Period  10/17/2018-09/17/2019    Authorization - Visit Number  11    Authorization - Number of Visits  23    PT Start Time  0815    PT Stop Time  0910    PT Time Calculation (min)  55 min    Activity Tolerance  Patient tolerated treatment well;No increased pain    Behavior During Therapy  WFL for tasks assessed/performed       Past Medical History:  Diagnosis Date  . Cholecystitis chronic, acute   . Constipation   . Dyspepsia   . Elevated cholesterol   . Fatigue   . Fibroid   . GERD (gastroesophageal reflux disease)   . Goiter, nontoxic, multinodular   . Hypothyroidism, acquired, autoimmune   . Ovarian cyst, right   . Thyroid disease    Hypothyroid  . Thyroiditis, autoimmune   . Vitamin D deficiency disease   . Vulvodynia     Past Surgical History:  Procedure Laterality Date  . ABDOMINAL HYSTERECTOMY     TAH  . CESAREAN SECTION    . TEMPOROMANDIBULAR JOINT SURGERY      There were no vitals filed for this visit.  Subjective Assessment - 08/03/19 0818    Subjective  My pelvic floor has been hurting since Sunday. I have had to use lots of ice packs.    Patient Stated Goals  stop hurting    Currently in Pain?  Yes    Pain Score  7     Pain Location  Vagina    Pain Orientation  Mid    Pain Descriptors / Indicators  Burning    Pain Type  Chronic pain    Pain Onset  More than a month ago    Pain Frequency  Constant    Aggravating Factors   not sure    Pain Relieving Factors  ice    Multiple Pain Sites  No                       [facility] Adult PT Treatment/Exercise - 08/03/19 0001      Lumbar Exercises: Aerobic   Nustep  level 4, 3 min, seat #6, Arm #6   while assessing patient     Modalities   Modalities  Electrical Stimulation;Cryotherapy      Cryotherapy   Number Minutes Cryotherapy  15 Minutes    Cryotherapy Location  Other (comment)   perineum   Type of Cryotherapy  Ice pack      Manual Therapy   Manual Therapy  Myofascial release    Myofascial Release  release of the urogenital diaphragm; release of the inner thighs with the inner side of the pubic rami             PT Education - 08/03/19 0859    Education Details  instructed pateint on how to use her TENS unit for pelvic pain    Person(s) Educated  Patient    Methods  Explanation  Comprehension  Verbalized understanding       PT Short Term Goals - 07/26/19 0936      PT SHORT TERM GOAL #1   Title  independent with initial HEP    Period  Weeks    Status  Achieved    Target Date  07/25/19      PT SHORT TERM GOAL #2   Title  pain with daily activities decreased by 25%    Time  4    Period  Weeks    Status  Achieved    Target Date  07/25/19      PT SHORT TERM GOAL #3   Title  pelvis in correct alignment to reduce strain on the pelvic floor    Time  4    Period  Weeks    Status  Achieved    Target Date  07/25/19      PT SHORT TERM GOAL #4   Title  understand how to use the wand to reduce the trigger points    Time  4    Period  Weeks    Status  On-going    Target Date  07/25/19        PT Long Term Goals - 06/27/19 1303      PT LONG TERM GOAL #1   Title  independent with HEP and understand how to progress herself    Time  12    Period  Weeks    Status  New    Target Date  09/19/19      PT LONG TERM GOAL #2   Title  pain with daily activities decreased >/= 75%    Time  12    Period  Weeks    Status  New    Target Date  09/19/19      PT LONG TERM  GOAL #3   Title  understand how to correct her posture to reduce strain on pelvic floor    Time  12    Period  Weeks    Status  New    Target Date  09/19/19      PT LONG TERM GOAL #4   Title  fatique with daily activities decreased >/= 75% due to decreased pain and increased energy    Time  12    Period  Weeks    Status  New    Target Date  09/19/19            Plan - 08/03/19 0856    Clinical Impression Statement  No goals met due to flare-up. Patient is having a flare-up so the session focused on pain and burning relief. Patient had fascial tightness in the urogenital diaphragm and inner thighs. Patient does not have the burning when laying on her back but in standing. Patient tried interferential current to assist in pain relief. Patient will benefit from skilled therapy to improve movement, reduce pain, and improve overall function.    Examination-Participation Restrictions  Interpersonal Relationship;Laundry;Cleaning    Stability/Clinical Decision Making  Evolving/Moderate complexity    Rehab Potential  Excellent    PT Frequency  2x / week    PT Duration  12 weeks    PT Treatment/Interventions  Biofeedback;Cryotherapy;Electrical Stimulation;Moist Heat;Ultrasound;Therapeutic exercise;Therapeutic activities;Neuromuscular re-education;Patient/family education;Dry needling;Scar mobilization;Manual techniques;Taping;Spinal Manipulations    PT Next Visit Plan  internal soft tissue work, correct pelvis; work on lumbar and thoracic fascia; work on posture with exercise and correct abdominal ; pelvic floor strength; squat for stretch; see if home   TENs unit is helping    PT Home Exercise Plan  Access Code: [email or web address]    Consulted and Agree with Plan of Care  Patient       Patient will benefit from skilled therapeutic intervention in order to improve the following deficits and impairments:  Decreased range of motion, Increased fascial restricitons, Increased muscle spasms, Decreased  activity tolerance, Pain, Decreased scar mobility, Decreased strength, Decreased mobility  Visit Diagnosis: Muscle weakness (generalized)  Cramp and spasm  Pelvic pain     Problem List Patient Active Problem List   Diagnosis Date Noted  . Situational depression 03/22/2014  . Cough 09/06/2013  . Hyperlipidemia 09/06/2013  . Fibroadenoma of right breast 05/26/2012  . Chronic pelvic pain in female 12/23/2011  . Hypothyroidism, acquired, autoimmune   . Thyroiditis, autoimmune   . Goiter   . Fatigue   . Vitamin D deficiency disease   . Dyspepsia   . Goiter, nontoxic, multinodular   . Anxiety 09/25/2011  . Ovarian cyst, right   . Constipation   . Acute cholecystitis 07/16/2011  . Thyroid disease   . Elevated cholesterol   . Fibroid   . Vulvodynia   . Pure hypercholesterolemia 03/12/2011    Earlie Counts, PT 08/03/19 9:02 AM   [facility] [address] [place], [place], [address] Phone: [phone]   Fax:  [phone]  Name: CATTIE TINEO MRN: [id number] Date of Birth: 03-27-1966

## 2019-08-05 ENCOUNTER — Encounter: Payer: 59 | Admitting: Physical Therapy

## 2019-08-08 ENCOUNTER — Encounter: Payer: 59 | Admitting: Physical Therapy

## 2019-08-10 ENCOUNTER — Other Ambulatory Visit: Payer: Self-pay

## 2019-08-10 ENCOUNTER — Ambulatory Visit: Payer: 59 | Admitting: Physical Therapy

## 2019-08-10 ENCOUNTER — Encounter: Payer: Self-pay | Admitting: Physical Therapy

## 2019-08-10 DIAGNOSIS — R102 Pelvic and perineal pain: Secondary | ICD-10-CM

## 2019-08-10 DIAGNOSIS — R252 Cramp and spasm: Secondary | ICD-10-CM

## 2019-08-10 DIAGNOSIS — M6281 Muscle weakness (generalized): Secondary | ICD-10-CM

## 2019-08-10 NOTE — Therapy (Signed)
[facility] [address], [place], [place], [address] Phone: [phone]   Fax:  [phone]  Physical Therapy Treatment  Patient Details  Name: [name] MRN: [id number] Date of Birth: 19-Feb-1966 Referring Provider (PT): Dr. Lindell Noe   Encounter Date: 08/10/2019  PT End of Session - 08/10/19 0943    Visit Number  11    Date for PT Re-Evaluation  09/19/19    Authorization Type  UHC    Authorization Time Period  10/17/2018-09/17/2019    Authorization - Visit Number  12    Authorization - Number of Visits  23    PT Start Time  0900    PT Stop Time  0940    PT Time Calculation (min)  40 min    Activity Tolerance  Patient tolerated treatment well;No increased pain    Behavior During Therapy  WFL for tasks assessed/performed       Past Medical History:  Diagnosis Date  . Cholecystitis chronic, acute   . Constipation   . Dyspepsia   . Elevated cholesterol   . Fatigue   . Fibroid   . GERD (gastroesophageal reflux disease)   . Goiter, nontoxic, multinodular   . Hypothyroidism, acquired, autoimmune   . Ovarian cyst, right   . Thyroid disease    Hypothyroid  . Thyroiditis, autoimmune   . Vitamin D deficiency disease   . Vulvodynia     Past Surgical History:  Procedure Laterality Date  . ABDOMINAL HYSTERECTOMY     TAH  . CESAREAN SECTION    . TEMPOROMANDIBULAR JOINT SURGERY      There were no vitals filed for this visit.  Subjective Assessment - 08/10/19 0906    Subjective  Not feeling good. Having alot of burning pain in the vaginal area. Using lots of ice packs. I have phone consult on 08/17/2019 with doctor.    Patient Stated Goals  stop hurting    Currently in Pain?  Yes    Pain Score  6     Pain Location  Vagina    Pain Orientation  Mid    Pain Descriptors / Indicators  Burning    Pain Type  Chronic pain    Pain Onset  More than a month ago    Pain Frequency  Constant    Aggravating  Factors   not sure    Pain Relieving Factors  ice    Multiple Pain Sites  No         [facility] PT Assessment - 08/10/19 0001      Assessment   Medical Diagnosis  N94.89 High-tone pelvic floor dysfunction    Referring Provider (PT)  Dr. Lindell Noe    Onset Date/Surgical Date  12/18/17      Precautions   Precautions  None      Palpation   SI assessment   sacrum rotated left, left ilium is rotated posteriorly                Pelvic Floor Special Questions - 08/10/19 0001    Skin Integrity  Erthema   vulva area   Pelvic Floor Internal Exam  Patient confirms identification and approves PT to assess pelvic floor and treatment    Exam Type  Vaginal    Palpation  tenderness in the pelvic floor with increased tone        [facility] Adult PT Treatment/Exercise - 08/10/19 0001      Manual Therapy   Manual  Therapy  Joint mobilization;Muscle Energy Technique;Internal Pelvic Floor    Joint Mobilization  P-A and rotational mobilization to T6-L5 ; sacral mobilization to correct left rotation,     Soft tissue mobilization  left iliopsoas, lumbar and thoracic paraspinals, quadratus; left bulbocavernosus    Internal Pelvic Floor  soft tissue work to the left pelvic floor muscles with hip movement monitoring for pain    Muscle Energy Technique  to correct lef tilium             PT Education - 08/10/19 0942    Education Details  gave patient a sample of [employer] REvelum to use on the vulvar area due to redness    Person(s) Educated  Patient    Methods  Explanation    Comprehension  Verbalized understanding       PT Short Term Goals - 07/26/19 0936      PT SHORT TERM GOAL #1   Title  independent with initial HEP    Period  Weeks    Status  Achieved    Target Date  07/25/19      PT SHORT TERM GOAL #2   Title  pain with daily activities decreased by 25%    Time  4    Period  Weeks    Status  Achieved    Target Date  07/25/19      PT SHORT TERM GOAL #3   Title   pelvis in correct alignment to reduce strain on the pelvic floor    Time  4    Period  Weeks    Status  Achieved    Target Date  07/25/19      PT SHORT TERM GOAL #4   Title  understand how to use the wand to reduce the trigger points    Time  4    Period  Weeks    Status  On-going    Target Date  07/25/19        PT Long Term Goals - 06/27/19 1303      PT LONG TERM GOAL #1   Title  independent with HEP and understand how to progress herself    Time  12    Period  Weeks    Status  New    Target Date  09/19/19      PT LONG TERM GOAL #2   Title  pain with daily activities decreased >/= 75%    Time  12    Period  Weeks    Status  New    Target Date  09/19/19      PT LONG TERM GOAL #3   Title  understand how to correct her posture to reduce strain on pelvic floor    Time  12    Period  Weeks    Status  New    Target Date  09/19/19      PT LONG TERM GOAL #4   Title  fatique with daily activities decreased >/= 75% due to decreased pain and increased energy    Time  12    Period  Weeks    Status  New    Target Date  09/19/19            Plan - 08/10/19 0943    Clinical Impression Statement  After manual work pain decreased to 5/10. Patient pelvis was in correct alignment after manual work. Patient had redness in the vulvar area but no tenderness. Therapist gave patient [employer]  to see if this woul dhelp with the burning. Patient has increased tone and tightness inthe pelvic floor muscles. Patient is in a flare up so no goals are met. Patient will benefit from skilled therpay to improve movement reduce pain, and improve overall function.    Examination-Participation Restrictions  Interpersonal Relationship;Laundry;Cleaning    Stability/Clinical Decision Making  Evolving/Moderate complexity    Rehab Potential  Excellent    PT Frequency  2x / week    PT Duration  12 weeks    PT Treatment/Interventions  Biofeedback;Cryotherapy;Electrical Stimulation;Moist  Heat;Ultrasound;Therapeutic exercise;Therapeutic activities;Neuromuscular re-education;Patient/family education;Dry needling;Scar mobilization;Manual techniques;Taping;Spinal Manipulations    PT Next Visit Plan  internal soft tissue work, correct pelvis; work on lumbar and thoracic fascia; work on posture with exercise and correct abdominal ; pelvic floor strength; squat for stretch; see if home TENs unit is helping    PT Home Exercise Plan  Access Code: [email or web address]    Consulted and Agree with Plan of Care  Patient       Patient will benefit from skilled therapeutic intervention in order to improve the following deficits and impairments:  Decreased range of motion, Increased fascial restricitons, Increased muscle spasms, Decreased activity tolerance, Pain, Decreased scar mobility, Decreased strength, Decreased mobility  Visit Diagnosis: Muscle weakness (generalized)  Cramp and spasm  Pelvic pain     Problem List Patient Active Problem List   Diagnosis Date Noted  . Situational depression 03/22/2014  . Cough 09/06/2013  . Hyperlipidemia 09/06/2013  . Fibroadenoma of right breast 05/26/2012  . Chronic pelvic pain in female 12/23/2011  . Hypothyroidism, acquired, autoimmune   . Thyroiditis, autoimmune   . Goiter   . Fatigue   . Vitamin D deficiency disease   . Dyspepsia   . Goiter, nontoxic, multinodular   . Anxiety 09/25/2011  . Ovarian cyst, right   . Constipation   . Acute cholecystitis 07/16/2011  . Thyroid disease   . Elevated cholesterol   . Fibroid   . Vulvodynia   . Pure hypercholesterolemia 03/12/2011    Earlie Counts, PT 08/10/19 9:46 AM   [facility] [address], [place], [place], [address] Phone: [phone]   Fax:  [phone]  Name: MABLE DARA MRN: [id number] Date of Birth: 03/14/1966

## 2019-08-12 ENCOUNTER — Ambulatory Visit: Payer: 59 | Admitting: Physical Therapy

## 2019-08-17 ENCOUNTER — Encounter: Payer: 59 | Admitting: Physical Therapy

## 2019-08-18 ENCOUNTER — Encounter: Payer: 59 | Admitting: Physical Therapy

## 2019-08-25 ENCOUNTER — Other Ambulatory Visit: Payer: Self-pay | Admitting: Physician Assistant

## 2019-08-25 DIAGNOSIS — R1314 Dysphagia, pharyngoesophageal phase: Secondary | ICD-10-CM

## 2019-08-25 DIAGNOSIS — K824 Cholesterolosis of gallbladder: Secondary | ICD-10-CM

## 2019-08-29 ENCOUNTER — Ambulatory Visit: Payer: 59 | Attending: Obstetrics & Gynecology | Admitting: Physical Therapy

## 2019-08-29 ENCOUNTER — Encounter: Payer: Self-pay | Admitting: Physical Therapy

## 2019-08-29 ENCOUNTER — Other Ambulatory Visit: Payer: Self-pay

## 2019-08-29 DIAGNOSIS — R252 Cramp and spasm: Secondary | ICD-10-CM | POA: Diagnosis present

## 2019-08-29 DIAGNOSIS — M6281 Muscle weakness (generalized): Secondary | ICD-10-CM | POA: Diagnosis not present

## 2019-08-29 DIAGNOSIS — R102 Pelvic and perineal pain: Secondary | ICD-10-CM | POA: Diagnosis present

## 2019-08-29 NOTE — Therapy (Signed)
[facility] [address] [place], [place], [address] Phone: [phone]   Fax:  [phone]  Physical Therapy Treatment  Patient Details  Name: [name] MRN: [id number] Date of Birth: [birth year] Referring Provider (PT): Dr. Lindell Noe   Encounter Date: 08/29/2019  PT End of Session - 08/29/19 0935    Visit Number  12    Date for PT Re-Evaluation  09/19/19    Authorization Type  UHC    Authorization Time Period  10/17/2018-09/17/2019    Authorization - Visit Number  13    Authorization - Number of Visits  23    PT Start Time  0930    PT Stop Time  1010    PT Time Calculation (min)  40 min    Activity Tolerance  Patient tolerated treatment well;No increased pain    Behavior During Therapy  WFL for tasks assessed/performed       Past Medical History:  Diagnosis Date  . Cholecystitis chronic, acute   . Constipation   . Dyspepsia   . Elevated cholesterol   . Fatigue   . Fibroid   . GERD (gastroesophageal reflux disease)   . Goiter, nontoxic, multinodular   . Hypothyroidism, acquired, autoimmune   . Ovarian cyst, right   . Thyroid disease    Hypothyroid  . Thyroiditis, autoimmune   . Vitamin D deficiency disease   . Vulvodynia     Past Surgical History:  Procedure Laterality Date  . ABDOMINAL HYSTERECTOMY     TAH  . CESAREAN SECTION    . TEMPOROMANDIBULAR JOINT SURGERY      There were no vitals filed for this visit.  Subjective Assessment - 08/29/19 0933    Subjective  I am feeling better. I had a virtual with my MD. I am on the estrogen cream for 2 weeks. I am taking the muscle relaxers nightly. I will see my MD on 09/21/2019.    Patient Stated Goals  stop hurting    Currently in Pain?  Yes    Pain Score  3     Pain Location  Vagina    Pain Orientation  Mid    Pain Descriptors / Indicators  Burning    Pain Type  Chronic pain    Pain Onset  More than a month ago    Pain Frequency   Constant    Aggravating Factors   stress, posture    Pain Relieving Factors  ice    Multiple Pain Sites  No         [facility] PT Assessment - 08/29/19 0001      Assessment   Medical Diagnosis  N94.89 High-tone pelvic floor dysfunction    Referring Provider (PT)  Dr. Lindell Noe    Onset Date/Surgical Date  12/18/17      Precautions   Precautions  None      Cognition   Overall Cognitive Status  Within Functional Limits for tasks assessed      Palpation   SI assessment   sacrum rotated left, right ilium is rotated anteriorly                   [facility] Adult PT Treatment/Exercise - 08/29/19 0001      Lumbar Exercises: Stretches   ITB Stretch  Right;Left;60 seconds   using the foam roll   Piriformis Stretch  Right;Left;1 rep;60 seconds   foam roll     Lumbar Exercises: Supine   Other Supine  Lumbar Exercises  laying on foam roll to decompress the spine, butterfly stretch, then place foam roll along the thoracic spine at level T6-8 and extend spine      Manual Therapy   Manual Therapy  Joint mobilization;Muscle Energy Technique;Internal Pelvic Floor    Joint Mobilization  P-A and rotational mobilization to T6-L5 ; sacral mobilization to correct left rotation,     Internal Pelvic Floor  bil. puborectalis, urethra sphincter, urethra, left obturator interniist    Muscle Energy Technique  to correct lef tilium               PT Short Term Goals - 07/26/19 0936      PT SHORT TERM GOAL #1   Title  independent with initial HEP    Period  Weeks    Status  Achieved    Target Date  07/25/19      PT SHORT TERM GOAL #2   Title  pain with daily activities decreased by 25%    Time  4    Period  Weeks    Status  Achieved    Target Date  07/25/19      PT SHORT TERM GOAL #3   Title  pelvis in correct alignment to reduce strain on the pelvic floor    Time  4    Period  Weeks    Status  Achieved    Target Date  07/25/19      PT SHORT TERM GOAL #4   Title   understand how to use the wand to reduce the trigger points    Time  4    Period  Weeks    Status  On-going    Target Date  07/25/19        PT Long Term Goals - 08/29/19 0938      PT LONG TERM GOAL #1   Title  independent with HEP and understand how to progress herself    Time  12    Period  Weeks    Status  On-going      PT LONG TERM GOAL #2   Title  pain with daily activities decreased >/= 75%    Baseline  60% better    Time  12    Period  Weeks    Status  On-going      PT LONG TERM GOAL #3   Title  understand how to correct her posture to reduce strain on pelvic floor    Time  12    Period  Weeks    Status  On-going      PT LONG TERM GOAL #4   Title  fatique with daily activities decreased >/= 75% due to decreased pain and increased energy    Baseline  60% better    Time  12    Period  Weeks    Status  On-going            Plan - 08/29/19 1014    Clinical Impression Statement  Patient pain level is 3/10 compared to last session is 6/10. Patient reports 60% better with pain and fatique. Patient pelvis was in correct alignment after therapy. Patient had trigger points located in the pelvic floor. Patient is now taking a muscle relaxer at night. Patient is not in a flare-up today. Patient will beneift from skilled therapy to improve movement reduce pain, and improve overall function.    Examination-Participation Restrictions  Interpersonal Relationship;Laundry;Cleaning    Stability/Clinical Decision Making  Evolving/Moderate complexity  Rehab Potential  Excellent    PT Frequency  2x / week    PT Duration  12 weeks    PT Treatment/Interventions  Biofeedback;Cryotherapy;Electrical Stimulation;Moist Heat;Ultrasound;Therapeutic exercise;Therapeutic activities;Neuromuscular re-education;Patient/family education;Dry needling;Scar mobilization;Manual techniques;Taping;Spinal Manipulations    PT Next Visit Plan  internal soft tissue work, correct pelvis; work on lumbar  and thoracic fascia; work on posture with exercise and correct abdominal ; pelvic floor strength; squat for stretch; see if home TENs unit is helping    PT Home Exercise Plan  Access Code: [email or web address]    Consulted and Agree with Plan of Care  Patient       Patient will benefit from skilled therapeutic intervention in order to improve the following deficits and impairments:  Decreased range of motion, Increased fascial restricitons, Increased muscle spasms, Decreased activity tolerance, Pain, Decreased scar mobility, Decreased strength, Decreased mobility  Visit Diagnosis: Muscle weakness (generalized)  Cramp and spasm  Pelvic pain     Problem List Patient Active Problem List   Diagnosis Date Noted  . Situational depression 03/22/2014  . Cough 09/06/2013  . Hyperlipidemia 09/06/2013  . Fibroadenoma of right breast 05/26/2012  . Chronic pelvic pain in female 12/23/2011  . Hypothyroidism, acquired, autoimmune   . Thyroiditis, autoimmune   . Goiter   . Fatigue   . Vitamin D deficiency disease   . Dyspepsia   . Goiter, nontoxic, multinodular   . Anxiety 09/25/2011  . Ovarian cyst, right   . Constipation   . Acute cholecystitis 07/16/2011  . Thyroid disease   . Elevated cholesterol   . Fibroid   . Vulvodynia   . Pure hypercholesterolemia 03/12/2011    Earlie Counts, PT 08/29/19 10:17 AM   [facility] [address] [place], [place], [address] Phone: [phone]   Fax:  [phone]  Name: [name] MRN: [id number] Date of Birth: 04-28-1966

## 2019-08-31 ENCOUNTER — Ambulatory Visit: Payer: 59 | Admitting: Physical Therapy

## 2019-08-31 ENCOUNTER — Encounter: Payer: Self-pay | Admitting: Physical Therapy

## 2019-08-31 ENCOUNTER — Other Ambulatory Visit: Payer: Self-pay

## 2019-08-31 DIAGNOSIS — M6281 Muscle weakness (generalized): Secondary | ICD-10-CM | POA: Diagnosis not present

## 2019-08-31 DIAGNOSIS — R102 Pelvic and perineal pain unspecified side: Secondary | ICD-10-CM

## 2019-08-31 DIAGNOSIS — R252 Cramp and spasm: Secondary | ICD-10-CM

## 2019-08-31 NOTE — Patient Instructions (Signed)

## 2019-08-31 NOTE — Therapy (Signed)
[facility] [address] [place], [place], [address] Phone: [phone]   Fax:  [phone]  Physical Therapy Treatment  Patient Details  Name: [name] MRN: [id number] Date of Birth: 02/04/1966 Referring Provider (PT): Dr. Lindell Noe   Encounter Date: 08/31/2019  PT End of Session - 08/31/19 1015    Visit Number  13    Date for PT Re-Evaluation  09/19/19    Authorization Time Period  10/17/2018-09/17/2019    Authorization - Number of Visits  24    PT Start Time  0930    PT Stop Time  1010    PT Time Calculation (min)  40 min    Activity Tolerance  Patient tolerated treatment well;No increased pain    Behavior During Therapy  WFL for tasks assessed/performed       Past Medical History:  Diagnosis Date  . Cholecystitis chronic, acute   . Constipation   . Dyspepsia   . Elevated cholesterol   . Fatigue   . Fibroid   . GERD (gastroesophageal reflux disease)   . Goiter, nontoxic, multinodular   . Hypothyroidism, acquired, autoimmune   . Ovarian cyst, right   . Thyroid disease    Hypothyroid  . Thyroiditis, autoimmune   . Vitamin D deficiency disease   . Vulvodynia     Past Surgical History:  Procedure Laterality Date  . ABDOMINAL HYSTERECTOMY     TAH  . CESAREAN SECTION    . TEMPOROMANDIBULAR JOINT SURGERY      There were no vitals filed for this visit.  Subjective Assessment - 08/31/19 0935    Subjective  My back is always tight. Using the estrogen cream and muscle relaxers are going well. The last visit helped.    Patient Stated Goals  stop hurting    Currently in Pain?  Yes    Pain Score  3     Pain Location  Vagina    Pain Orientation  Mid    Pain Descriptors / Indicators  Burning    Pain Type  Chronic pain    Pain Onset  More than a month ago    Pain Frequency  Constant    Aggravating Factors   stress, posture    Pain Relieving Factors  ice    Multiple Pain Sites  No          [facility] PT Assessment - 08/31/19 0001      Palpation   SI assessment   left ilium posteriorly rotated,     Palpation comment  left iliopsoas tender                    [facility] Adult PT Treatment/Exercise - 08/31/19 0001      Exercises   Exercises  Other Exercises    Other Exercises   sitting with therapist stretching the diaphgram and moving trunk forward and side to side; diaphragmatic breathing with expanding the pelvic floor, abdomen and moving the diaphgram      Lumbar Exercises: Stretches   Piriformis Stretch  Right;Left;1 rep;60 seconds   foam roll     Lumbar Exercises: Supine   Other Supine Lumbar Exercises  laying on foam roll to decompress the spine, butterfly stretch, then place foam roll along the thoracic spine at level T6-8 and extend spine      Manual Therapy   Manual Therapy  Joint mobilization;Soft tissue mobilization;Myofascial release;Muscle Energy Technique    Joint Mobilization  P-A and rotational  mobilization to T6-L5 ; sacral mobilization to correct left rotation, bilateral rib movement to open up the ribs    Soft tissue mobilization  left iliopsoas in supine, diaphgram, thoracic and lumbar paraspinals using the addaday; soft tissue work to the left quandratus and left gluteal    Muscle Energy Technique  to correct lef tilium       Trigger Point Dry Needling - 08/31/19 0001    Consent Given?  Yes    Education Handout Provided  Yes    Dry Needling Comments  T4-T12   trigger point response, elongation of tissue          PT Education - 08/31/19 1012    Education Details  information on dry needling    Person(s) Educated  Patient    Methods  Explanation;Handout    Comprehension  Verbalized understanding       PT Short Term Goals - 07/26/19 0936      PT SHORT TERM GOAL #1   Title  independent with initial HEP    Period  Weeks    Status  Achieved    Target Date  07/25/19      PT SHORT TERM GOAL #2   Title  pain with daily  activities decreased by 25%    Time  4    Period  Weeks    Status  Achieved    Target Date  07/25/19      PT SHORT TERM GOAL #3   Title  pelvis in correct alignment to reduce strain on the pelvic floor    Time  4    Period  Weeks    Status  Achieved    Target Date  07/25/19      PT SHORT TERM GOAL #4   Title  understand how to use the wand to reduce the trigger points    Time  4    Period  Weeks    Status  On-going    Target Date  07/25/19        PT Long Term Goals - 08/29/19 0938      PT LONG TERM GOAL #1   Title  independent with HEP and understand how to progress herself    Time  12    Period  Weeks    Status  On-going      PT LONG TERM GOAL #2   Title  pain with daily activities decreased >/= 75%    Baseline  60% better    Time  12    Period  Weeks    Status  On-going      PT LONG TERM GOAL #3   Title  understand how to correct her posture to reduce strain on pelvic floor    Time  12    Period  Weeks    Status  On-going      PT LONG TERM GOAL #4   Title  fatique with daily activities decreased >/= 75% due to decreased pain and increased energy    Baseline  60% better    Time  12    Period  Weeks    Status  On-going            Plan - 08/31/19 1030    Clinical Impression Statement  Patient had tight thoracolumbar junction with a tight diaphragm that makes it difficult for the pelvic floor to relax and correcting her posture in sitting and standing. After therapy pelvis was in correct  alignment. Patient is  managing her pain better with taking the adjustment of her medication and improvement of mobility. Patient will benefit from skilled therapy to improve mobility, relaxation of the muscles improve posture and strength.    Examination-Participation Restrictions  Interpersonal Relationship;Laundry;Cleaning    Stability/Clinical Decision Making  Evolving/Moderate complexity    Rehab Potential  Excellent    PT Frequency  2x / week    PT Duration  12  weeks    PT Treatment/Interventions  Biofeedback;Cryotherapy;Electrical Stimulation;Moist Heat;Ultrasound;Therapeutic exercise;Therapeutic activities;Neuromuscular re-education;Patient/family education;Dry needling;Scar mobilization;Manual techniques;Taping;Spinal Manipulations    PT Next Visit Plan  internal soft tissue work, correct pelvis; work on lumbar and thoracic fascia; work on posture with exercise and correct abdominal ; pelvic floor strength; squat for stretch; see if home TENs unit is helping    PT Home Exercise Plan  Access Code: [email or web address]    Consulted and Agree with Plan of Care  Patient       Patient will benefit from skilled therapeutic intervention in order to improve the following deficits and impairments:  Decreased range of motion, Increased fascial restricitons, Increased muscle spasms, Decreased activity tolerance, Pain, Decreased scar mobility, Decreased strength, Decreased mobility  Visit Diagnosis: Muscle weakness (generalized)  Cramp and spasm  Pelvic pain     Problem List Patient Active Problem List   Diagnosis Date Noted  . Situational depression 03/22/2014  . Cough 09/06/2013  . Hyperlipidemia 09/06/2013  . Fibroadenoma of right breast 05/26/2012  . Chronic pelvic pain in female 12/23/2011  . Hypothyroidism, acquired, autoimmune   . Thyroiditis, autoimmune   . Goiter   . Fatigue   . Vitamin D deficiency disease   . Dyspepsia   . Goiter, nontoxic, multinodular   . Anxiety 09/25/2011  . Ovarian cyst, right   . Constipation   . Acute cholecystitis 07/16/2011  . Thyroid disease   . Elevated cholesterol   . Fibroid   . Vulvodynia   . Pure hypercholesterolemia 03/12/2011    Earlie Counts, PT 08/31/19 11:20 AM   [facility] [address] [place], [place], [address] Phone: [phone]   Fax:  [phone]  Name: [name] MRN: [id number] Date of Birth: 1966/04/27

## 2019-09-02 ENCOUNTER — Ambulatory Visit
Admission: RE | Admit: 2019-09-02 | Discharge: 2019-09-02 | Disposition: A | Discharge status: Home / Self Care | Payer: 59 | Source: Ambulatory Visit | Attending: Physician Assistant | Admitting: Physician Assistant

## 2019-09-02 DIAGNOSIS — K824 Cholesterolosis of gallbladder: Secondary | ICD-10-CM

## 2019-09-02 DIAGNOSIS — R1314 Dysphagia, pharyngoesophageal phase: Secondary | ICD-10-CM

## 2019-09-05 ENCOUNTER — Other Ambulatory Visit: Payer: Self-pay

## 2019-09-05 ENCOUNTER — Encounter: Payer: Self-pay | Admitting: Physical Therapy

## 2019-09-05 ENCOUNTER — Ambulatory Visit: Payer: 59 | Admitting: Physical Therapy

## 2019-09-05 DIAGNOSIS — R252 Cramp and spasm: Secondary | ICD-10-CM

## 2019-09-05 DIAGNOSIS — M6281 Muscle weakness (generalized): Secondary | ICD-10-CM | POA: Diagnosis not present

## 2019-09-05 DIAGNOSIS — R102 Pelvic and perineal pain: Secondary | ICD-10-CM

## 2019-09-05 NOTE — Therapy (Signed)
[facility] [address] [place], [place], [address] Phone: [phone]   Fax:  [phone]  Physical Therapy Treatment  Patient Details  Name: [name] MRN: [id number] Date of Birth: [birth year] Referring Provider (PT): Dr. Lindell Noe   Encounter Date: 09/05/2019  PT End of Session - 09/05/19 0927    Visit Number  14    Date for PT Re-Evaluation  09/19/19    Authorization Type  [employer]    Authorization Time Period  10/17/2018-09/17/2019    Authorization - Visit Number  14    Authorization - Number of Visits  24    PT Start Time  0845    PT Stop Time  0925    PT Time Calculation (min)  40 min    Activity Tolerance  Patient tolerated treatment well;No increased pain    Behavior During Therapy  WFL for tasks assessed/performed       Past Medical History:  Diagnosis Date  . Cholecystitis chronic, acute   . Constipation   . Dyspepsia   . Elevated cholesterol   . Fatigue   . Fibroid   . GERD (gastroesophageal reflux disease)   . Goiter, nontoxic, multinodular   . Hypothyroidism, acquired, autoimmune   . Ovarian cyst, right   . Thyroid disease    Hypothyroid  . Thyroiditis, autoimmune   . Vitamin D deficiency disease   . Vulvodynia     Past Surgical History:  Procedure Laterality Date  . ABDOMINAL HYSTERECTOMY     TAH  . CESAREAN SECTION    . TEMPOROMANDIBULAR JOINT SURGERY      There were no vitals filed for this visit.  Subjective Assessment - 09/05/19 0849    Subjective  I have alot of tightness in my back. The burning vaginally has been good. Yesterday I did not think of the burning for the whole day.    Patient Stated Goals  stop hurting    Currently in Pain?  Yes    Pain Score  3     Pain Location  Vagina    Pain Orientation  Mid    Pain Descriptors / Indicators  Burning    Pain Type  Chronic pain    Pain Onset  More than a month ago    Pain Frequency  Constant    Aggravating  Factors   stress , posture    Pain Relieving Factors  ice    Multiple Pain Sites  Yes    Pain Score  5    Pain Location  Back    Pain Orientation  Mid    Pain Descriptors / Indicators  Tightness    Pain Type  Chronic pain    Pain Onset  More than a month ago    Pain Frequency  Constant    Aggravating Factors   not sure    Pain Relieving Factors  stretch                    Pelvic Floor Special Questions - 09/05/19 0001    Strength  weak squeeze, no lift        [facility] Adult PT Treatment/Exercise - 09/05/19 0001      Self-Care   Self-Care  Other Self-Care Comments    Other Self-Care Comments   instructed patient on how to use the home TENs unit for pelvic and mid back pain      Lumbar Exercises: Stretches   Lower Trunk Rotation  2 reps;30 seconds   left leg across the pelvis      Manual Therapy   Manual Therapy  Joint mobilization;Internal Pelvic Floor    Joint Mobilization  P-A and rotational mobilization to T6-L5 ; sacral mobilization to correct left rotation, bilateral rib movement to open up the ribs    Soft tissue mobilization  left iliopsoas to release the tissue    Internal Pelvic Floor  left introitus, left levator ani, left ATLA       Trigger Point Dry Needling - 09/05/19 0001    Consent Given?  Yes    Education Handout Provided  Previously provided    Dry Needling Comments  T4-T12   trigger point response, elongation of tissue          PT Education - 09/05/19 0925    Education Details  information on using the Home TENS unit and where to place the electrodes for pelvic pain and mid thoracic    Person(s) Educated  Patient    Methods  Explanation;Demonstration;Handout    Comprehension  Verbalized understanding       PT Short Term Goals - 09/05/19 0930      PT SHORT TERM GOAL #4   Title  understand how to use the wand to reduce the trigger points    Time  4    Period  Weeks    Status  Achieved        PT Long Term Goals - 08/29/19  [id number]      PT LONG TERM GOAL #1   Title  independent with HEP and understand how to progress herself    Time  12    Period  Weeks    Status  On-going      PT LONG TERM GOAL #2   Title  pain with daily activities decreased >/= 75%    Baseline  60% better    Time  12    Period  Weeks    Status  On-going      PT LONG TERM GOAL #3   Title  understand how to correct her posture to reduce strain on pelvic floor    Time  12    Period  Weeks    Status  On-going      PT LONG TERM GOAL #4   Title  fatique with daily activities decreased >/= 75% due to decreased pain and increased energy    Baseline  60% better    Time  12    Period  Weeks    Status  On-going            Plan - 09/05/19 [id number]    Clinical Impression Statement  Patient has not had the burning of the pelvic floor for thepast few days. Patient did not feel any pain yesterday in the pelvic floor. Patient pain level decreased to 2/10 after manual work. Patient understands how to use the home TENS unit for pain releif. Patient pelvis was in correct alignment. Patient pelvic floor strength is 2/5 and able to bulge the pelvic floor. Patient will benefit from skilled therapy to improve mobility, relaxtion of the muscles to imporve posture and strength.    Examination-Participation Restrictions  Interpersonal Relationship;Laundry;Cleaning    Stability/Clinical Decision Making  Evolving/Moderate complexity    Rehab Potential  Excellent    PT Frequency  2x / week    PT Duration  12 weeks    PT Treatment/Interventions  Biofeedback;Cryotherapy;Electrical Stimulation;Moist Heat;Ultrasound;Therapeutic exercise;Therapeutic activities;Neuromuscular re-education;Patient/family education;Dry needling;Scar mobilization;Manual  techniques;Taping;Spinal Manipulations    PT Next Visit Plan  internal soft tissue work, correct pelvis; work on lumbar and thoracic fascia; work on posture with exercise and correct abdominal ; pelvic floor strength;  squat for stretch; see if home TENs unit is helping    PT Home Exercise Plan  Access Code: [email or web address]    Consulted and Agree with Plan of Care  Patient       Patient will benefit from skilled therapeutic intervention in order to improve the following deficits and impairments:  Decreased range of motion, Increased fascial restricitons, Increased muscle spasms, Decreased activity tolerance, Pain, Decreased scar mobility, Decreased strength, Decreased mobility  Visit Diagnosis: Muscle weakness (generalized)  Cramp and spasm  Pelvic pain     Problem List Patient Active Problem List   Diagnosis Date Noted  . Situational depression 03/22/2014  . Cough 09/06/2013  . Hyperlipidemia 09/06/2013  . Fibroadenoma of right breast 05/26/2012  . Chronic pelvic pain in female 12/23/2011  . Hypothyroidism, acquired, autoimmune   . Thyroiditis, autoimmune   . Goiter   . Fatigue   . Vitamin D deficiency disease   . Dyspepsia   . Goiter, nontoxic, multinodular   . Anxiety 09/25/2011  . Ovarian cyst, right   . Constipation   . Acute cholecystitis 07/16/2011  . Thyroid disease   . Elevated cholesterol   . Fibroid   . Vulvodynia   . Pure hypercholesterolemia 03/12/2011    Earlie Counts, PT 09/05/19 9:31 AM   [facility] [address] [place], [place], [address] Phone: [phone]   Fax:  [phone]  Name: DEVRA KOPINSKI MRN: [id number] Date of Birth: 10/16/1966

## 2019-09-07 ENCOUNTER — Ambulatory Visit: Payer: 59 | Admitting: Physical Therapy

## 2019-09-07 ENCOUNTER — Other Ambulatory Visit: Payer: Self-pay

## 2019-09-07 ENCOUNTER — Encounter: Payer: Self-pay | Admitting: Physical Therapy

## 2019-09-07 DIAGNOSIS — M6281 Muscle weakness (generalized): Secondary | ICD-10-CM

## 2019-09-07 DIAGNOSIS — R252 Cramp and spasm: Secondary | ICD-10-CM

## 2019-09-07 DIAGNOSIS — R102 Pelvic and perineal pain: Secondary | ICD-10-CM

## 2019-09-07 NOTE — Therapy (Signed)
[facility] [address] [place], [place], [address] Phone: [phone]   Fax:  [phone]  Physical Therapy Treatment  Patient Details  Name: [name] MRN: [id number] Date of Birth: 12/21/1965 Referring Provider (PT): Dr. Lindell Noe   Encounter Date: 09/07/2019  PT End of Session - 09/07/19 0929    Visit Number  15    Date for PT Re-Evaluation  09/19/19    Authorization Time Period  10/17/2018-09/17/2019    Authorization - Visit Number  15    Authorization - Number of Visits  24    PT Start Time  0845    PT Stop Time  [id number]    PT Time Calculation (min)  40 min    Activity Tolerance  Patient tolerated treatment well;No increased pain    Behavior During Therapy  WFL for tasks assessed/performed       Past Medical History:  Diagnosis Date  . Cholecystitis chronic, acute   . Constipation   . Dyspepsia   . Elevated cholesterol   . Fatigue   . Fibroid   . GERD (gastroesophageal reflux disease)   . Goiter, nontoxic, multinodular   . Hypothyroidism, acquired, autoimmune   . Ovarian cyst, right   . Thyroid disease    Hypothyroid  . Thyroiditis, autoimmune   . Vitamin D deficiency disease   . Vulvodynia     Past Surgical History:  Procedure Laterality Date  . ABDOMINAL HYSTERECTOMY     TAH  . CESAREAN SECTION    . TEMPOROMANDIBULAR JOINT SURGERY      There were no vitals filed for this visit.  Subjective Assessment - 09/07/19 0849    Subjective  I saw the orthopedist about hip and back and did not say anything about it. MD said there is nothing structural about my hip. I am on predinsone to reduce inflamation. Dry needling did not change my back tightness from last visit. The TENs unit helps. Fatique level is better.    Patient Stated Goals  stop hurting    Currently in Pain?  Yes    Pain Score  3     Pain Location  Vagina    Pain Orientation  Mid    Pain Descriptors / Indicators  Burning     Pain Type  Chronic pain    Pain Onset  More than a month ago    Pain Frequency  Constant    Aggravating Factors   stress, posture    Pain Relieving Factors  ice    Pain Score  5    Pain Location  Back    Pain Orientation  Mid    Pain Descriptors / Indicators  Tightness    Pain Type  Chronic pain    Pain Onset  More than a month ago    Pain Frequency  Constant    Aggravating Factors   not sure    Pain Relieving Factors  stretch                       [facility] Adult PT Treatment/Exercise - 09/07/19 0001      Self-Care   Self-Care  Other Self-Care Comments    Other Self-Care Comments   educated patient on you tube videos to do for trunk rotation and deep hip stretches      Lumbar Exercises: Stretches   Piriformis Stretch  Right;Left;1 rep;60 seconds   foam roll     Lumbar Exercises: Supine  Other Supine Lumbar Exercises  laying on foam roll to decompress the spine, butterfly stretch, then place foam roll along the thoracic spine at level T6-8 and extend spine      Shoulder Exercises: Standing   Other Standing Exercises  lunge pose with twist both ways to stretch spine and pelvic floor      Manual Therapy   Manual Therapy  Joint mobilization;Internal Pelvic Floor    Joint Mobilization  P-A and rotational mobilization to T5-L5 to improve trunk rotation and pelvic floor relaxation    Internal Pelvic Floor  left introitus, left levator ani, left ATLA             PT Education - 09/07/19 0927    Education Details  information on you tube videos for trunk and hip  stretches    Person(s) Educated  Patient    Methods  Explanation;Handout    Comprehension  Verbalized understanding       PT Short Term Goals - 09/05/19 0930      PT SHORT TERM GOAL #4   Title  understand how to use the wand to reduce the trigger points    Time  4    Period  Weeks    Status  Achieved        PT Long Term Goals - 09/07/19 0931      PT LONG TERM GOAL #1   Title   independent with HEP and understand how to progress herself    Time  12    Period  Weeks    Status  On-going      PT LONG TERM GOAL #2   Title  pain with daily activities decreased >/= 75%    Time  12    Period  Weeks    Status  On-going      PT LONG TERM GOAL #3   Title  understand how to correct her posture to reduce strain on pelvic floor    Time  12    Period  Weeks    Status  On-going      PT LONG TERM GOAL #4   Title  fatique with daily activities decreased >/= 75% due to decreased pain and increased energy    Time  12    Period  Weeks    Status  On-going            Plan - 09/07/19 0929    Clinical Impression Statement  Patient has alot of tightness in the thoracic and lumbar spine that affects the pelvic floor. Patient has decreased balance in yoga poses. After manual work she had improved flexibility of the spine. Patient pelvic floor tissue feels softer and less palpable pain. Patient will benefit from skilled therpay to improve mobility, relaxation o fthe muscles to improve posture and strnegth.    Examination-Participation Restrictions  Interpersonal Relationship;Laundry;Cleaning    Stability/Clinical Decision Making  Evolving/Moderate complexity    Rehab Potential  Excellent    PT Frequency  2x / week    PT Duration  12 weeks    PT Treatment/Interventions  Biofeedback;Cryotherapy;Electrical Stimulation;Moist Heat;Ultrasound;Therapeutic exercise;Therapeutic activities;Neuromuscular re-education;Patient/family education;Dry needling;Scar mobilization;Manual techniques;Taping;Spinal Manipulations    PT Next Visit Plan  internal soft tissue work, correct pelvis; work on lumbar and thoracic fascia; work on posture with exercise and correct abdominal ; pelvic floor strength; squat for stretch; see if home TENs unit is helping    PT Home Exercise Plan  Access Code: [email or web address]    Consulted and Agree  with Plan of Care  Patient       Patient will benefit from skilled  therapeutic intervention in order to improve the following deficits and impairments:  Decreased range of motion, Increased fascial restricitons, Increased muscle spasms, Decreased activity tolerance, Pain, Decreased scar mobility, Decreased strength, Decreased mobility  Visit Diagnosis: Muscle weakness (generalized)  Cramp and spasm  Pelvic pain     Problem List Patient Active Problem List   Diagnosis Date Noted  . Situational depression 03/22/2014  . Cough 09/06/2013  . Hyperlipidemia 09/06/2013  . Fibroadenoma of right breast 05/26/2012  . Chronic pelvic pain in female 12/23/2011  . Hypothyroidism, acquired, autoimmune   . Thyroiditis, autoimmune   . Goiter   . Fatigue   . Vitamin D deficiency disease   . Dyspepsia   . Goiter, nontoxic, multinodular   . Anxiety 09/25/2011  . Ovarian cyst, right   . Constipation   . Acute cholecystitis 07/16/2011  . Thyroid disease   . Elevated cholesterol   . Fibroid   . Vulvodynia   . Pure hypercholesterolemia 03/12/2011   Earlie Counts, PT 09/07/19 9:33 AM   [facility] [address], [place], [place], [address] Phone: [phone]   Fax:  [phone]  Name: [name] MRN: [id number] Date of Birth: 13-Mar-1966

## 2019-09-07 NOTE — Patient Instructions (Signed)
Lower Back & Hip Stretch & Strengthen Yoga Class - [employer]   Spine Strength & Stretch Yoga Class - [employer]  Top 3 Pelvic Floor Stretches for Women and Men  Quick Release  Pelvic Floor Release Stretches  [employer]  [facility] [address], [place], [place] [address] Phone # [phone] Fax [phone]

## 2019-09-12 ENCOUNTER — Encounter: Payer: Self-pay | Admitting: Physical Therapy

## 2019-09-12 ENCOUNTER — Ambulatory Visit: Payer: 59 | Admitting: Physical Therapy

## 2019-09-12 ENCOUNTER — Other Ambulatory Visit: Payer: Self-pay

## 2019-09-12 DIAGNOSIS — M6281 Muscle weakness (generalized): Secondary | ICD-10-CM | POA: Diagnosis not present

## 2019-09-12 DIAGNOSIS — R252 Cramp and spasm: Secondary | ICD-10-CM

## 2019-09-12 DIAGNOSIS — R102 Pelvic and perineal pain: Secondary | ICD-10-CM

## 2019-09-12 NOTE — Therapy (Signed)
[facility] [address], [place], [place], [address] Phone: [phone]   Fax:  [phone]  Physical Therapy Treatment  Patient Details  Name: [name] MRN: [id number] Date of Birth: 28-Oct-1966 Referring Provider (PT): Dr. Lindell Noe   Encounter Date: 09/12/2019  PT End of Session - 09/12/19 0851    Visit Number  16    Date for PT Re-Evaluation  09/19/19    Authorization Type  UHC    Authorization Time Period  10/17/2018-09/17/2019    Authorization - Visit Number  16    Authorization - Number of Visits  24    PT Start Time  0845    PT Stop Time  0925    PT Time Calculation (min)  40 min    Activity Tolerance  Patient tolerated treatment well;No increased pain    Behavior During Therapy  WFL for tasks assessed/performed       Past Medical History:  Diagnosis Date  . Cholecystitis chronic, acute   . Constipation   . Dyspepsia   . Elevated cholesterol   . Fatigue   . Fibroid   . GERD (gastroesophageal reflux disease)   . Goiter, nontoxic, multinodular   . Hypothyroidism, acquired, autoimmune   . Ovarian cyst, right   . Thyroid disease    Hypothyroid  . Thyroiditis, autoimmune   . Vitamin D deficiency disease   . Vulvodynia     Past Surgical History:  Procedure Laterality Date  . ABDOMINAL HYSTERECTOMY     TAH  . CESAREAN SECTION    . TEMPOROMANDIBULAR JOINT SURGERY      There were no vitals filed for this visit.  Subjective Assessment - 09/12/19 0848    Subjective  Today I am having a flare-up. I did not use the home TENS unit. I used the muscle relaxer and cream and ice. The perdinsone is not helping my hips as well. Over the weekend the back pain was better.    Patient Stated Goals  stop hurting    Currently in Pain?  Yes    Pain Score  8     Pain Location  Vagina    Pain Orientation  Mid    Pain Descriptors / Indicators  Burning    Pain Type  Chronic pain    Pain Onset  More  than a month ago    Pain Frequency  Constant    Aggravating Factors   stress posture    Pain Relieving Factors  ice    Multiple Pain Sites  Yes    Pain Score  4    Pain Location  Back    Pain Orientation  Mid    Pain Descriptors / Indicators  Tightness    Pain Type  Chronic pain    Pain Onset  More than a month ago    Pain Frequency  Constant    Aggravating Factors   not sure    Pain Relieving Factors  stretch                    Pelvic Floor Special Questions - 09/12/19 0001    Pelvic Floor Internal Exam  Patient confirms identification and approves PT to assess pelvic floor and treatment    Exam Type  Vaginal    Palpation  tighter on the right , more relesae on the left        [facility] Adult PT Treatment/Exercise - 09/12/19 0001  Lumbar Exercises: Stretches   Press Ups  10 reps    Press Ups Limitations  prone on arms then lift arm for rotation    Piriformis Stretch  Right;Left;1 rep;60 seconds   foam roll   Other Lumbar Stretch Exercise  supine on foam roll to extend spine, roll foam roll along the spine      Shoulder Exercises: Standing   Other Standing Exercises  lunge pose with twist both ways to stretch spine and pelvic floor      Manual Therapy   Manual Therapy  Joint mobilization;Soft tissue mobilization;Internal Pelvic Floor    Manual therapy comments  prone left hip extension to rotated the left ilium anteriorly     Joint Mobilization  P-A and rotational mobilization to T5-L5 to improve trunk rotation and pelvic floor relaxation; rotationof the sacrum to the right    Soft tissue mobilization  bil. iliopsoas,     Internal Pelvic Floor  bil. levator ani, obturator internist               PT Short Term Goals - 09/05/19 0930      PT SHORT TERM GOAL #4   Title  understand how to use the wand to reduce the trigger points    Time  4    Period  Weeks    Status  Achieved        PT Long Term Goals - 09/07/19 0931      PT LONG TERM GOAL #1    Title  independent with HEP and understand how to progress herself    Time  12    Period  Weeks    Status  On-going      PT LONG TERM GOAL #2   Title  pain with daily activities decreased >/= 75%    Time  12    Period  Weeks    Status  On-going      PT LONG TERM GOAL #3   Title  understand how to correct her posture to reduce strain on pelvic floor    Time  12    Period  Weeks    Status  On-going      PT LONG TERM GOAL #4   Title  fatique with daily activities decreased >/= 75% due to decreased pain and increased energy    Time  12    Period  Weeks    Status  On-going            Plan - 09/12/19 0853    Clinical Impression Statement  Patient is having a flare-up today so her pain is higher than usual. Patient still has stiffness in the thoracic. Patient pelvic floor is tighter on the right but more fascial release on the left. Patient pelvis was in correct alignment after manaual work. Patient pain level decreased to 6/10 after therapy. Patient was instructed to use her home TENS unit when she is having a flare-up. Patient will benefit from skilled therapy to improve mobility, relaxation of the muscles to improve posture and strength.    Examination-Participation Restrictions  Interpersonal Relationship;Laundry;Cleaning    Stability/Clinical Decision Making  Evolving/Moderate complexity    Rehab Potential  Excellent    PT Frequency  2x / week    PT Duration  12 weeks    PT Treatment/Interventions  Biofeedback;Cryotherapy;Electrical Stimulation;Moist Heat;Ultrasound;Therapeutic exercise;Therapeutic activities;Neuromuscular re-education;Patient/family education;Dry needling;Scar mobilization;Manual techniques;Taping;Spinal Manipulations    PT Next Visit Plan  write renewal, work on thoracic mobility, work on pelvic floor  muscles,    PT Home Exercise Plan  Access Code: [email or web address]    Consulted and Agree with Plan of Care  Patient       Patient will benefit from skilled  therapeutic intervention in order to improve the following deficits and impairments:  Decreased range of motion, Increased fascial restricitons, Increased muscle spasms, Decreased activity tolerance, Pain, Decreased scar mobility, Decreased strength, Decreased mobility  Visit Diagnosis: Muscle weakness (generalized)  Cramp and spasm  Pelvic pain     Problem List Patient Active Problem List   Diagnosis Date Noted  . Situational depression 03/22/2014  . Cough 09/06/2013  . Hyperlipidemia 09/06/2013  . Fibroadenoma of right breast 05/26/2012  . Chronic pelvic pain in female 12/23/2011  . Hypothyroidism, acquired, autoimmune   . Thyroiditis, autoimmune   . Goiter   . Fatigue   . Vitamin D deficiency disease   . Dyspepsia   . Goiter, nontoxic, multinodular   . Anxiety 09/25/2011  . Ovarian cyst, right   . Constipation   . Acute cholecystitis 07/16/2011  . Thyroid disease   . Elevated cholesterol   . Fibroid   . Vulvodynia   . Pure hypercholesterolemia 03/12/2011    Earlie Counts, PT 09/12/19 9:29 AM   [facility] [address], [place], [place], [address] Phone: [phone]   Fax:  [phone]  Name: [name] MRN: [id number] Date of Birth: [birth year]

## 2019-09-14 ENCOUNTER — Encounter: Payer: Self-pay | Admitting: Physical Therapy

## 2019-09-14 ENCOUNTER — Other Ambulatory Visit: Payer: Self-pay

## 2019-09-14 ENCOUNTER — Ambulatory Visit: Payer: 59 | Admitting: Physical Therapy

## 2019-09-14 DIAGNOSIS — R102 Pelvic and perineal pain unspecified side: Secondary | ICD-10-CM

## 2019-09-14 DIAGNOSIS — R252 Cramp and spasm: Secondary | ICD-10-CM

## 2019-09-14 DIAGNOSIS — M6281 Muscle weakness (generalized): Secondary | ICD-10-CM

## 2019-09-14 NOTE — Therapy (Signed)
[facility] [address], [place], [place], [address] Phone: [phone]   Fax:  [phone]  Physical Therapy Treatment  Patient Details  Name: [name] MRN: [id number] Date of Birth: [birth year] Referring Provider (PT): Dr. Lindell Noe   Encounter Date: 09/14/2019  PT End of Session - 09/14/19 0854    Visit Number  17    Date for PT Re-Evaluation  12/07/19    Authorization Type  UHC    Authorization Time Period  10/17/2018-09/17/2019    Authorization - Visit Number  17    Authorization - Number of Visits  24    PT Start Time  0845    PT Stop Time  0925    PT Time Calculation (min)  40 min    Activity Tolerance  Patient tolerated treatment well;No increased pain    Behavior During Therapy  WFL for tasks assessed/performed       Past Medical History:  Diagnosis Date  . Cholecystitis chronic, acute   . Constipation   . Dyspepsia   . Elevated cholesterol   . Fatigue   . Fibroid   . GERD (gastroesophageal reflux disease)   . Goiter, nontoxic, multinodular   . Hypothyroidism, acquired, autoimmune   . Ovarian cyst, right   . Thyroid disease    Hypothyroid  . Thyroiditis, autoimmune   . Vitamin D deficiency disease   . Vulvodynia     Past Surgical History:  Procedure Laterality Date  . ABDOMINAL HYSTERECTOMY     TAH  . CESAREAN SECTION    . TEMPOROMANDIBULAR JOINT SURGERY      There were no vitals filed for this visit.  Subjective Assessment - 09/14/19 0851    Subjective  Today is not a good day. I used my TENS unit yesterday and it helped. The hip is settling down.    Patient Stated Goals  stop hurting    Currently in Pain?  Yes    Pain Score  7     Pain Location  Vagina    Pain Orientation  Mid    Pain Descriptors / Indicators  Burning    Pain Type  Chronic pain    Pain Onset  More than a month ago    Pain Frequency  Constant    Aggravating Factors   stress    Pain Relieving Factors   ice    Multiple Pain Sites  No    Pain Score  4    Pain Location  Back    Pain Orientation  Mid    Pain Descriptors / Indicators  Tightness    Pain Type  Chronic pain    Pain Onset  More than a month ago    Pain Frequency  Constant    Aggravating Factors   not sure    Pain Relieving Factors  stretch         [facility] PT Assessment - 09/14/19 0001      Assessment   Medical Diagnosis  N94.89 High-tone pelvic floor dysfunction    Referring Provider (PT)  Dr. Lindell Noe    Onset Date/Surgical Date  12/18/17    Prior Therapy  yes, MD wants her to have internal work      Precautions   Precautions  None      Restrictions   Weight Bearing Restrictions  No      [place] residence      Prior Function  Level of Independence  Independent      Cognition   Overall Cognitive Status  Within Functional Limits for tasks assessed      Posture/Postural Control   Posture/Postural Control  Postural limitations    Postural Limitations  Rounded Shoulders;Forward head;Flexed trunk;Increased thoracic kyphosis      ROM / Strength   AROM / PROM / Strength  AROM;PROM;Strength      AROM   Lumbar - Right Side Bend  full    Lumbar - Left Side Bend  full    Lumbar - Right Rotation  decreased by 25%    Lumbar - Left Rotation  decreased by 50%      Strength   Right Hip External Rotation   4/5    Right Hip ABduction  4-/5    Left Hip Extension  4/5    Left Hip External Rotation  5/5    Left Hip ABduction  3+/5    Left Hip ADduction  5/5      Palpation   SI assessment   left ilium posteriorly rotated,     Palpation comment  left iliopsoas tender                 Pelvic Floor Special Questions - 09/14/19 0001    Pelvic Floor Internal Exam  Patient confirms identification and approves PT to assess pelvic floor and treatment    Exam Type  Vaginal    Palpation  tender suprapubically    Strength  fair squeeze, definite lift        [facility]  Adult PT Treatment/Exercise - 09/14/19 0001      Lumbar Exercises: Stretches   ITB Stretch  Right;Left;60 seconds    ITB Stretch Limitations  foam roll    Piriformis Stretch  Right;Left;1 rep;60 seconds   foam roll   Piriformis Stretch Limitations  foam roll    Other Lumbar Stretch Exercise  supine on foam roll to extend spine, roll foam roll along the spine    Other Lumbar Stretch Exercise  lay on tennis ball across thoracic spine to improve extension      Manual Therapy   Manual Therapy  Joint mobilization;Soft tissue mobilization;Muscle Energy Technique    Manual therapy comments  stretching of the right posterior hip in supine    Joint Mobilization  P-A and rotational mobilization to T5-L5 to improve trunk rotation and pelvic floor relaxation; sidely gapping of the lumbar facets both sides,     Soft tissue mobilization  bil. iliopsoas, left diaphragm. left transverse abdominus; soft tissue work suprapubically    Internal Pelvic Floor  bil. levator ani, obturator internist, suprapubically, along the urethra sphincter and neck of bladder    Muscle Energy Technique  to correct lef tilium               PT Short Term Goals - 09/05/19 0930      PT SHORT TERM GOAL #4   Title  understand how to use the wand to reduce the trigger points    Time  4    Period  Weeks    Status  Achieved        PT Long Term Goals - 09/14/19 [id number]      PT LONG TERM GOAL #1   Title  independent with HEP and understand how to progress herself    Baseline  still learning how to progress her exercises as she feel better    Time  12    Period  Weeks    Status  On-going      PT LONG TERM GOAL #2   Title  pain with daily activities decreased >/= 75%    Baseline  40% better from 60% due to present flare-up    Time  12    Period  Weeks    Status  On-going      PT LONG TERM GOAL #3   Title  understand how to correct her posture to reduce strain on pelvic floor    Time  12    Period  Weeks     Status  Achieved      PT LONG TERM GOAL #4   Title  fatique with daily activities decreased >/= 75% due to decreased pain and increased energy    Baseline  40% better was 60% due to flare-up    Time  12    Period  Weeks    Status  On-going            Plan - 09/14/19 0854    Clinical Impression Statement  Patient back pain is 50% bettter and at pain level 4/10. Patient is award of how her posture can affect her pelvic floor and she is correcting it on her own. Vaginal pain is at 7/10 due to a flare-up recently. Pelvic floor pain overall since initial eval is 40% better but prior the the recent flare-up was 60% better. Patient fatique level due to pain is 40% better. Left ilium is rotation posteriorly and is able to be corrected with manual therapy. Lumbar rotation is 50% restricted to the left and 25% to the right. Lumbar sidebending is full bilaterally now. Patient has tenderness located more suprapubically compared to internally now. Left hip abduction is 3+/5 and extension is 4/5 and right hip abduction is 4-/5. Patient would benefit from skilled therapy to imporve mobility, relaxation of the muscles and improve hip strength.    Examination-Participation Restrictions  Interpersonal Relationship;Laundry;Cleaning    Stability/Clinical Decision Making  Evolving/Moderate complexity    Rehab Potential  Excellent    PT Frequency  2x / week    PT Duration  12 weeks    PT Treatment/Interventions  Biofeedback;Cryotherapy;Electrical Stimulation;Moist Heat;Ultrasound;Therapeutic exercise;Therapeutic activities;Neuromuscular re-education;Patient/family education;Dry needling;Scar mobilization;Manual techniques;Taping;Spinal Manipulations    PT Next Visit Plan  work on thoracic mobility, work on pelvic floor muscles, work suprapubically, review abdominal strength    PT Home Exercise Plan  Access Code: [email or web address]    Recommended Other Services  sent renewal to MD on 09/14/2019    Consulted and Agree with  Plan of Care  Patient       Patient will benefit from skilled therapeutic intervention in order to improve the following deficits and impairments:  Decreased range of motion, Increased fascial restricitons, Increased muscle spasms, Decreased activity tolerance, Pain, Decreased scar mobility, Decreased strength, Decreased mobility  Visit Diagnosis: Muscle weakness (generalized) - Plan: PT plan of care cert/re-cert  Cramp and spasm - Plan: PT plan of care cert/re-cert  Pelvic pain - Plan: PT plan of care cert/re-cert     Problem List Patient Active Problem List   Diagnosis Date Noted  . Situational depression 03/22/2014  . Cough 09/06/2013  . Hyperlipidemia 09/06/2013  . Fibroadenoma of right breast 05/26/2012  . Chronic pelvic pain in female 12/23/2011  . Hypothyroidism, acquired, autoimmune   . Thyroiditis, autoimmune   . Goiter   . Fatigue   . Vitamin D deficiency disease   . Dyspepsia   . Goiter, nontoxic,  multinodular   . Anxiety 09/25/2011  . Ovarian cyst, right   . Constipation   . Acute cholecystitis 07/16/2011  . Thyroid disease   . Elevated cholesterol   . Fibroid   . Vulvodynia   . Pure hypercholesterolemia 03/12/2011    Earlie Counts, PT 09/14/19 11:23 AM   [facility] [address], [place], [place], [address] Phone: [phone]   Fax:  [phone]  Name: [name] MRN: [id number] Date of Birth: Jan 10, 1966

## 2019-09-23 ENCOUNTER — Encounter: Payer: Self-pay | Admitting: Physical Therapy

## 2019-09-26 ENCOUNTER — Encounter: Payer: 59 | Admitting: Physical Therapy

## 2019-09-28 ENCOUNTER — Encounter: Payer: 59 | Admitting: Physical Therapy

## 2019-10-04 ENCOUNTER — Other Ambulatory Visit: Payer: Self-pay

## 2019-10-04 ENCOUNTER — Ambulatory Visit: Payer: 59 | Attending: Obstetrics & Gynecology | Admitting: Physical Therapy

## 2019-10-04 ENCOUNTER — Encounter: Payer: Self-pay | Admitting: Physical Therapy

## 2019-10-04 DIAGNOSIS — R102 Pelvic and perineal pain: Secondary | ICD-10-CM

## 2019-10-04 DIAGNOSIS — R252 Cramp and spasm: Secondary | ICD-10-CM

## 2019-10-04 DIAGNOSIS — M6281 Muscle weakness (generalized): Secondary | ICD-10-CM | POA: Diagnosis not present

## 2019-10-04 NOTE — Therapy (Addendum)
[facility] [address], [place], [place], [address] Phone: [phone]   Fax:  [phone]  Physical Therapy Treatment  Patient Details  Name: [name] MRN: [id number] Date of Birth: 03/14/1966 Referring Provider (PT): Dr. Lindell Noe   Encounter Date: 10/04/2019  PT End of Session - 10/04/19 0852    Visit Number  18    Date for PT Re-Evaluation  12/07/19    Authorization Type  UHC    PT Start Time  0845    PT Stop Time  0925    PT Time Calculation (min)  40 min    Activity Tolerance  Patient tolerated treatment well;No increased pain    Behavior During Therapy  WFL for tasks assessed/performed       Past Medical History:  Diagnosis Date  . Cholecystitis chronic, acute   . Constipation   . Dyspepsia   . Elevated cholesterol   . Fatigue   . Fibroid   . GERD (gastroesophageal reflux disease)   . Goiter, nontoxic, multinodular   . Hypothyroidism, acquired, autoimmune   . Ovarian cyst, right   . Thyroid disease    Hypothyroid  . Thyroiditis, autoimmune   . Vitamin D deficiency disease   . Vulvodynia     Past Surgical History:  Procedure Laterality Date  . ABDOMINAL HYSTERECTOMY     TAH  . CESAREAN SECTION    . TEMPOROMANDIBULAR JOINT SURGERY      There were no vitals filed for this visit.  Subjective Assessment - 10/04/19 0850    Subjective  My doctor did a biopsy of the vulva area and it was negative. I will be cancelling my appointments due to going to [facility] for my daughter.    Patient Stated Goals  stop hurting    Currently in Pain?  Yes    Pain Score  4     Pain Location  Vagina    Pain Orientation  Mid    Pain Descriptors / Indicators  Burning    Pain Type  Chronic pain    Pain Onset  More than a month ago    Pain Frequency  Constant    Aggravating Factors   stress    Pain Relieving Factors  ice    Multiple Pain Sites  No                       [facility] Adult  PT Treatment/Exercise - 10/04/19 0001      Lumbar Exercises: Stretches   ITB Stretch  Right;Left;60 seconds    ITB Stretch Limitations  foam roll    Piriformis Stretch  Right;Left;1 rep;60 seconds   foam roll   Piriformis Stretch Limitations  foam roll    Other Lumbar Stretch Exercise  supine on foam roll to extend spine, roll foam roll along the spine      Manual Therapy   Manual Therapy  Joint mobilization;Soft tissue mobilization;Internal Pelvic Floor    Joint Mobilization  P-A and rotational mobilization to T3-T12 in prone, ribs 5-8 mobilization posteriorly    Soft tissue mobilization  thoracic and lumbar paraspinals     Internal Pelvic Floor  along the bulbocavernosus, ischiocavernosus, right obturtator internist,  urethra sphinter       Trigger Point Dry Needling - 10/04/19 0001    Consent Given?  Yes    Education Handout Provided  Previously provided    Dry Needling Comments  T4-T12   trigger point  response, elongation of tissue            PT Short Term Goals - 09/05/19 0930      PT SHORT TERM GOAL #4   Title  understand how to use the wand to reduce the trigger points    Time  4    Period  Weeks    Status  Achieved        PT Long Term Goals - 10/04/19 8850      PT LONG TERM GOAL #1   Title  independent with HEP and understand how to progress herself    Baseline  still learning how to progress her exercises as she feel better    Time  12    Period  Weeks    Status  On-going      PT LONG TERM GOAL #2   Title  pain with daily activities decreased >/= 75%    Baseline  40% better from 60% due to present flare-up    Time  12    Period  Weeks    Status  On-going      PT LONG TERM GOAL #3   Title  understand how to correct her posture to reduce strain on pelvic floor    Time  12    Period  Weeks    Status  Achieved      PT LONG TERM GOAL #4   Title  fatique with daily activities decreased >/= 75% due to decreased pain and increased energy    Baseline   40% better was 60% due to flare-up    Time  12    Period  Weeks    Status  On-going            Plan - 10/04/19 0933    Clinical Impression Statement  Patient had a biopsy of the vulva that was negative. Patient will not be back to therapy till January due to having to quarantine and go to [place] for medical treatment for her daughter. Patient pain level decreased to 2/10 from 4/10 after manual work. Patient has increased extension of thoracic spine. Patient had many trigger points in the Thoracic paraspinals. Patient vaginal tissue sofent very well with the manual work. Patient will benefit from skilled therapy to improve mobility, relaxation of the muscles and improve hip strength.    Examination-Participation Restrictions  Interpersonal Relationship;Laundry;Cleaning    Stability/Clinical Decision Making  Evolving/Moderate complexity    Rehab Potential  Excellent    PT Frequency  2x / week    PT Duration  12 weeks    PT Treatment/Interventions  Biofeedback;Cryotherapy;Electrical Stimulation;Moist Heat;Ultrasound;Therapeutic exercise;Therapeutic activities;Neuromuscular re-education;Patient/family education;Dry needling;Scar mobilization;Manual techniques;Taping;Spinal Manipulations    PT Next Visit Plan  work on thoracic mobility, work on pelvic floor muscles, work suprapubically, review abdominal strength    PT Home Exercise Plan  Access Code: [email or web address]    Consulted and Agree with Plan of Care  Patient       Patient will benefit from skilled therapeutic intervention in order to improve the following deficits and impairments:  Decreased range of motion, Increased fascial restricitons, Increased muscle spasms, Decreased activity tolerance, Pain, Decreased scar mobility, Decreased strength, Decreased mobility  Visit Diagnosis: Muscle weakness (generalized)  Cramp and spasm  Pelvic pain     Problem List Patient Active Problem List   Diagnosis Date Noted  . Situational  depression 03/22/2014  . Cough 09/06/2013  . Hyperlipidemia 09/06/2013  . Fibroadenoma of right breast 05/26/2012  . Chronic  pelvic pain in female 12/23/2011  . Hypothyroidism, acquired, autoimmune   . Thyroiditis, autoimmune   . Goiter   . Fatigue   . Vitamin D deficiency disease   . Dyspepsia   . Goiter, nontoxic, multinodular   . Anxiety 09/25/2011  . Ovarian cyst, right   . Constipation   . Acute cholecystitis 07/16/2011  . Thyroid disease   . Elevated cholesterol   . Fibroid   . Vulvodynia   . Pure hypercholesterolemia 03/12/2011    Earlie Counts, PT 10/04/19 9:38 AM   [facility] [address] [place], [place], [address] Phone: [phone]   Fax:  [phone]  Name: ADRENA NAKAMURA MRN: [id number] Date of Birth: February 26, 1966  PHYSICAL THERAPY DISCHARGE SUMMARY  Visits from Start of Care: 18  Current functional level related to goals / functional outcomes: See above. Patient cancelled all appointments due to COVID and has not made anymore since then.     Remaining deficits: See above.    Education / Equipment: HEP Plan:                                                    Patient goals were not met. Patient is being discharged due to not returning since the last visit. Thank you for the referral. Earlie Counts, PT 01/16/20 9:15 AM   ?????

## 2019-10-07 ENCOUNTER — Encounter: Payer: 59 | Admitting: Physical Therapy

## 2019-10-11 ENCOUNTER — Encounter: Payer: 59 | Admitting: Physical Therapy

## 2019-10-18 ENCOUNTER — Encounter: Payer: 59 | Admitting: Physical Therapy

## 2019-10-21 ENCOUNTER — Encounter: Payer: 59 | Admitting: Physical Therapy

## 2019-11-09 ENCOUNTER — Other Ambulatory Visit: Payer: Self-pay | Admitting: Gastroenterology

## 2019-11-09 DIAGNOSIS — R935 Abnormal findings on diagnostic imaging of other abdominal regions, including retroperitoneum: Secondary | ICD-10-CM

## 2019-11-21 ENCOUNTER — Ambulatory Visit: Payer: 59 | Admitting: Physical Therapy

## 2019-11-25 ENCOUNTER — Encounter: Payer: 59 | Admitting: Physical Therapy

## 2019-11-28 ENCOUNTER — Encounter: Payer: 59 | Admitting: Physical Therapy

## 2019-11-30 ENCOUNTER — Encounter: Payer: 59 | Admitting: Physical Therapy

## 2019-12-05 ENCOUNTER — Encounter: Payer: 59 | Admitting: Physical Therapy

## 2019-12-07 ENCOUNTER — Encounter: Payer: 59 | Admitting: Physical Therapy

## 2020-02-22 ENCOUNTER — Ambulatory Visit: Payer: 59 | Attending: Obstetrics & Gynecology | Admitting: Physical Therapy

## 2020-02-22 ENCOUNTER — Other Ambulatory Visit: Payer: Self-pay

## 2020-02-22 ENCOUNTER — Encounter: Payer: Self-pay | Admitting: Physical Therapy

## 2020-02-22 DIAGNOSIS — M6281 Muscle weakness (generalized): Secondary | ICD-10-CM | POA: Insufficient documentation

## 2020-02-22 DIAGNOSIS — R102 Pelvic and perineal pain: Secondary | ICD-10-CM | POA: Diagnosis present

## 2020-02-22 DIAGNOSIS — R252 Cramp and spasm: Secondary | ICD-10-CM | POA: Diagnosis present

## 2020-02-22 NOTE — Patient Instructions (Addendum)
Guided Meditation for Pelvic Floor Relaxation  [employer]    [facility] [address] [place], [place] [address] Phone # [phone] Fax [phone]

## 2020-02-22 NOTE — Therapy (Signed)
[facility] [address], [place], [place], [address] Phone: [phone]   Fax:  [phone]  Physical Therapy Evaluation  Patient Details  Name: [name] MRN: [id number] Date of Birth: 1966/11/06 Referring Provider (PT): Dr. Lindell Noe   Encounter Date: 02/22/2020  PT End of Session - 02/22/20 1056    Visit Number  1    Date for PT Re-Evaluation  05/16/20    Authorization Type  [employer]    PT Start Time  [id number]    PT Stop Time  1050    PT Time Calculation (min)  35 min    Activity Tolerance  Patient tolerated treatment well;No increased pain    Behavior During Therapy  WFL for tasks assessed/performed       Past Medical History:  Diagnosis Date  . Cholecystitis chronic, acute   . Constipation   . Dyspepsia   . Elevated cholesterol   . Fatigue   . Fibroid   . GERD (gastroesophageal reflux disease)   . Goiter, nontoxic, multinodular   . Hypothyroidism, acquired, autoimmune   . Ovarian cyst, right   . Thyroid disease    Hypothyroid  . Thyroiditis, autoimmune   . Vitamin D deficiency disease   . Vulvodynia     Past Surgical History:  Procedure Laterality Date  . ABDOMINAL HYSTERECTOMY     TAH  . CESAREAN SECTION    . TEMPOROMANDIBULAR JOINT SURGERY      There were no vitals filed for this visit.   Subjective Assessment - 02/22/20 1029    Subjective  Patient reports her pain has flared up 3 weeks ago. Patient reports the burning pain, pressure with going to the bathroom, strain to urinate.    Patient Stated Goals  stop hurting    Currently in Pain?  Yes    Pain Score  8     Pain Location  Vagina    Pain Orientation  Mid    Pain Descriptors / Indicators  Burning    Pain Type  Acute pain    Pain Onset  More than a month ago    Pain Frequency  Constant    Aggravating Factors   feeling to go to the bathroom,    Pain Relieving Factors  ice packs    Multiple Pain Sites  No         [facility] PT  Assessment - 02/22/20 0001      Assessment   Medical Diagnosis  N94.89 High-tone pelvic floor dysfunction; R10., G89.29 Chronic pelvic pain in female    Referring Provider (PT)  Dr. Lindell Noe    Onset Date/Surgical Date  02/08/20    Prior Therapy  yes      Precautions   Precautions  Anterior Hip      Restrictions   Weight Bearing Restrictions  No      Balance Screen   Has the patient fallen in the past 6 months  No    Has the patient had a decrease in activity level because of a fear of falling?   No    Is the patient reluctant to leave their home because of a fear of falling?   No      Home Environment   Living Environment  Private residence      Prior Function   Level of Independence  Independent    Leisure  just started back exercising for 02/16/2020      Cognition   Overall Cognitive  Status  Within Functional Limits for tasks assessed      Posture/Postural Control   Posture/Postural Control  Postural limitations    Postural Limitations  Rounded Shoulders;Forward head;Increased thoracic kyphosis;Posterior pelvic tilt      ROM / Strength   AROM / PROM / Strength  AROM;PROM;Strength      AROM   Lumbar Extension  decreased by 25%    Lumbar - Right Side Bend  decreased 25%    Lumbar - Left Side Bend  decreased by 25%    Lumbar - Right Rotation  decreased by 25%    Lumbar - Left Rotation  decreased by 25%      PROM   Overall PROM Comments  tightness at endrange of hip flexion      Strength   Right Hip Flexion  5/5    Right Hip Extension  5/5    Right Hip External Rotation   4/5    Right Hip Internal Rotation  4/5    Right Hip ABduction  3+/5    Right Hip ADduction  5/5    Left Hip Flexion  5/5    Left Hip Extension  3+/5    Left Hip External Rotation  4/5    Left Hip Internal Rotation  4/5    Left Hip ABduction  3+/5    Left Hip ADduction  5/5      Palpation   SI assessment   right ilium is rotated anteriorly    Palpation comment  Difficulty with lifing  the lower rib cage anteriorly; tightness in the diaphragm, suprapubically,       Special Tests    Special Tests  Hip Special Tests    Hip Special Tests   Trendelenberg Test      Trendelenburg Test   Findings  Positive    Side  Left    Comments  right hip drops                Objective measurements completed on examination: See above findings.    Pelvic Floor Special Questions - 02/22/20 0001    Skin Integrity  Erthema    External Palpation  8, 7, 6 O'clock q-tip test    Pelvic Floor Internal Exam  Patient confirms identification and approves PT to assess pelvic floor and treatment    Exam Type  Vaginal    Palpation  placed q-tip in the vaginal canal and patient had pain, touched the inside left of the vaginal canal with q-tip and had pain.     Strength  --   not assessed due to pain and tightness              PT Education - 02/22/20 1704    Education Details  you tube video for pelvic floor meditation    Person(s) Educated  Patient    Methods  Explanation;Handout    Comprehension  Verbalized understanding       PT Short Term Goals - 02/22/20 1113      PT SHORT TERM GOAL #1   Title  independent with initial HEP    Time  4    Period  Weeks    Status  New    Target Date  03/21/20      PT SHORT TERM GOAL #2   Title  burning pain with daily activities decreased by 25%    Time  4    Period  Weeks    Status  New    Target Date  03/21/20      PT SHORT TERM GOAL #3   Title  pelvis in correct alignment to reduce strain on the pelvic floor    Time  4    Period  Weeks    Status  New    Target Date  03/21/20        PT Long Term Goals - 02/22/20 1618      PT LONG TERM GOAL #1   Title  independent with HEP and understand how to progress herself    Baseline  ---    Time  12    Period  Weeks    Status  New    Target Date  05/16/20      PT LONG TERM GOAL #2   Title  pain with daily activities decreased >/= 75%    Baseline  --    Period  Weeks     Status  New    Target Date  05/16/20      PT LONG TERM GOAL #3   Title  understand how to correct her posture to reduce strain on pelvic floor with reduction of kyphosis and posterior pelvic tilt    Time  12    Period  Weeks    Status  New    Target Date  05/16/20      PT LONG TERM GOAL #4   Title  fatique with daily activities decreased >/= 75% due to decreased pain and increased energy    Baseline  ----    Time  12    Period  Weeks    Status  New    Target Date  05/16/20      PT LONG TERM GOAL #5   Title  able to have vaginal penetration with no more than 1-2/10 pain due to decreased irritation of the vulva and healthier tissue    Time  12    Period  Weeks    Status  New    Target Date  05/16/20             Plan - 02/22/20 1704    Clinical Impression Statement  Patient is a 54 year old female with Vulvodynia that has flare-up in the past 3 weeks. Patient reports her burning pain is constant 8/10 with urination and during the day. Patient feels pressure when she goes to the bathroom and has to strain to urinate. Patient posture includes thoracic kyphosis, posterior pelvic tilt, forward head and rounded shoulders. Patient has weak hips and core. Patient pelvis is rotated on the right ilium and decreased lumbar mobilty. Patient has decreased thoracic mobility. Tenderness located in the right abdominal and suprapubic. Patient has erythmia along the vulvar. Q-tip test has tenderness located on 6, 7, and 8 O'Clock. When placing the q-tip in the vaginal canal the patient had pain and when the q-tip touched the left pelvic floor muscles. Patient will benefit from skilled therapy to elongate her tissue, improve posture, improve strength and reduce vaginal pain.    Personal Factors and Comorbidities  Comorbidity 2;Sex    Comorbidities  Abdominal hysterectomy, c-section, fibroid    Examination-Activity Limitations  Toileting    Examination-Participation Restrictions  Interpersonal  Relationship;Community Activity    Stability/Clinical Decision Making  Evolving/Moderate complexity    Clinical Decision Making  Moderate    Rehab Potential  Good    PT Frequency  1x / week    PT Duration  12 weeks    PT Treatment/Interventions  Cryotherapy;Electrical Stimulation;Moist Heat;Ultrasound;Neuromuscular  re-education;Therapeutic exercise;Therapeutic activities;Patient/family education;Manual techniques;Dry needling;Taping;Spinal Manipulations    PT Next Visit Plan  work on fascial release along the inner thigh, lumbar, abdominal; correct pelvis, lumbar and thoracic mobilization, fascial release around the vulva; educated patient on using a q-tip on the vulvar area    Consulted and Agree with Plan of Care  Patient       Patient will benefit from skilled therapeutic intervention in order to improve the following deficits and impairments:  Decreased coordination, Pain, Increased muscle spasms, Increased fascial restricitons, Decreased strength, Decreased mobility  Visit Diagnosis: Muscle weakness (generalized) - Plan: PT plan of care cert/re-cert  Cramp and spasm - Plan: PT plan of care cert/re-cert  Pelvic pain - Plan: PT plan of care cert/re-cert     Problem List Patient Active Problem List   Diagnosis Date Noted  . Situational depression 03/22/2014  . Cough 09/06/2013  . Hyperlipidemia 09/06/2013  . Fibroadenoma of right breast 05/26/2012  . Chronic pelvic pain in female 12/23/2011  . Hypothyroidism, acquired, autoimmune   . Thyroiditis, autoimmune   . Goiter   . Fatigue   . Vitamin D deficiency disease   . Dyspepsia   . Goiter, nontoxic, multinodular   . Anxiety 09/25/2011  . Ovarian cyst, right   . Constipation   . Acute cholecystitis 07/16/2011  . Thyroid disease   . Elevated cholesterol   . Fibroid   . Vulvodynia   . Pure hypercholesterolemia 03/12/2011    Earlie Counts, PT 02/22/20 5:18 PM   [facility] [address] [place], [place], [address] Phone: [phone]   Fax:  [phone]  Name: [name] MRN: [id number] Date of Birth: [birth year]

## 2020-03-14 ENCOUNTER — Encounter: Payer: 59 | Admitting: Physical Therapy

## 2020-03-28 ENCOUNTER — Other Ambulatory Visit: Payer: Self-pay

## 2020-03-28 ENCOUNTER — Encounter: Payer: Self-pay | Admitting: Physical Therapy

## 2020-03-28 ENCOUNTER — Ambulatory Visit: Payer: 59 | Attending: Obstetrics & Gynecology | Admitting: Physical Therapy

## 2020-03-28 DIAGNOSIS — R252 Cramp and spasm: Secondary | ICD-10-CM | POA: Diagnosis present

## 2020-03-28 DIAGNOSIS — R102 Pelvic and perineal pain: Secondary | ICD-10-CM | POA: Diagnosis present

## 2020-03-28 DIAGNOSIS — M6281 Muscle weakness (generalized): Secondary | ICD-10-CM

## 2020-03-28 NOTE — Patient Instructions (Signed)
Access Code: [email or web address] URL: [email or web address] Date: 07/15/2019 Prepared by: Earlie Counts  Exercises Supine Hamstring Stretch with Strap - 1 x daily - 7 x weekly - 2 reps - 1 sets - 30 sec hold Hip Adductors and Hamstring Stretch with Strap - 1 x daily - 7 x weekly - 2 reps - 1 sets - 30 sec hold Supine Pelvic Floor Stretch - 1 x daily - 7 x weekly - 1 reps - 1 sets - 1 min hold Prone Quadriceps Stretch with Strap - 1 x daily - 7 x weekly - 2 reps - 1 sets - 30 sec hold Piriformis Mobilization on Foam Roll - 1 x daily - 7 x weekly - 10 reps - 1 sets Hip Flexor Mobilization with Foam Roll - 1 x daily - 7 x weekly - 10 reps - 1 sets Sidelying IT Band Foam Roll Mobilization - 1 x daily - 7 x weekly - 10 reps - 1 sets Hamstring Mobilization on Foam Roll - 1 x daily - 7 x weekly - 10 reps - 1 sets Supine Pelvic Floor Stretch - 1 x daily - 7 x weekly - 1 reps - 1 sets - 1 min hold [facility] [address], [place], [place] [address] Phone # [phone] Fax [phone]

## 2020-03-28 NOTE — Therapy (Signed)
[facility] [address] [place], [place], [address] Phone: [phone]   Fax:  [phone]  Physical Therapy Treatment  Patient Details  Name: [name] MRN: [id number] Date of Birth: 10/21/1966 Referring Provider (PT): Dr. Lindell Noe   Encounter Date: 03/28/2020  PT End of Session - 03/28/20 1145    Visit Number  2    Date for PT Re-Evaluation  05/16/20    Authorization Type  [employer]    Authorization - Visit Number  2    Authorization - Number of Visits  23    PT Start Time  1100    PT Stop Time  1140    PT Time Calculation (min)  40 min    Activity Tolerance  Patient tolerated treatment well;No increased pain    Behavior During Therapy  WFL for tasks assessed/performed       Past Medical History:  Diagnosis Date  . Cholecystitis chronic, acute   . Constipation   . Dyspepsia   . Elevated cholesterol   . Fatigue   . Fibroid   . GERD (gastroesophageal reflux disease)   . Goiter, nontoxic, multinodular   . Hypothyroidism, acquired, autoimmune   . Ovarian cyst, right   . Thyroid disease    Hypothyroid  . Thyroiditis, autoimmune   . Vitamin D deficiency disease   . Vulvodynia     Past Surgical History:  Procedure Laterality Date  . ABDOMINAL HYSTERECTOMY     TAH  . CESAREAN SECTION    . TEMPOROMANDIBULAR JOINT SURGERY      There were no vitals filed for this visit.  Subjective Assessment - 03/28/20 1104    Subjective  I feel a little better.    Patient Stated Goals  stop hurting    Currently in Pain?  Yes    Pain Score  6     Pain Location  Vagina    Pain Orientation  Mid    Pain Descriptors / Indicators  Burning    Pain Type  Acute pain    Pain Onset  More than a month ago    Pain Frequency  Constant    Aggravating Factors   feeling to go to the bathroom    Pain Relieving Factors  ice packs    Multiple Pain Sites  No                        [facility] Adult PT  Treatment/Exercise - 03/28/20 0001      Therapeutic Activites    Therapeutic Activities  Other Therapeutic Activities    Other Therapeutic Activities  instruction on sitting upright and lifting her chest to reduce the thoracic kyphosis      Neuro Re-ed    Neuro Re-ed Details   breathing into the lateral and anterior rib cage then into her stomach to relax the pelvic floor and open the diaphgram with tactile cues to the rib cage      Lumbar Exercises: Stretches   Other Lumbar Stretch Exercise  lay along the spine with foam roll to elongate the spine      Manual Therapy   Manual Therapy  Joint mobilization;Soft tissue mobilization    Manual therapy comments  using the suction cup to release the fascia along the upper abdomen    Joint Mobilization  anterior lower rib cage ; PA and rotational mobilization to T4-L5; sacral springing on the right    Soft tissue mobilization  upper abdoment and diaphragm to release the tight tissue for breath             PT Education - 03/28/20 1143    Education Details  Access Code: [email or web address]    Person(s) Educated  Patient    Methods  Explanation;Demonstration;Verbal cues;Handout    Comprehension  Verbalized understanding;Returned demonstration       PT Short Term Goals - 02/22/20 1113      PT SHORT TERM GOAL #1   Title  independent with initial HEP    Time  4    Period  Weeks    Status  New    Target Date  03/21/20      PT SHORT TERM GOAL #2   Title  burning pain with daily activities decreased by 25%    Time  4    Period  Weeks    Status  New    Target Date  03/21/20      PT SHORT TERM GOAL #3   Title  pelvis in correct alignment to reduce strain on the pelvic floor    Time  4    Period  Weeks    Status  New    Target Date  03/21/20        PT Long Term Goals - 02/22/20 1618      PT LONG TERM GOAL #1   Title  independent with HEP and understand how to progress herself    Baseline  ---    Time  12    Period  Weeks    Status   New    Target Date  05/16/20      PT LONG TERM GOAL #2   Title  pain with daily activities decreased >/= 75%    Baseline  --    Period  Weeks    Status  New    Target Date  05/16/20      PT LONG TERM GOAL #3   Title  understand how to correct her posture to reduce strain on pelvic floor with reduction of kyphosis and posterior pelvic tilt    Time  12    Period  Weeks    Status  New    Target Date  05/16/20      PT LONG TERM GOAL #4   Title  fatique with daily activities decreased >/= 75% due to decreased pain and increased energy    Baseline  ----    Time  12    Period  Weeks    Status  New    Target Date  05/16/20      PT LONG TERM GOAL #5   Title  able to have vaginal penetration with no more than 1-2/10 pain due to decreased irritation of the vulva and healthier tissue    Time  12    Period  Weeks    Status  New    Target Date  05/16/20            Plan - 03/28/20 1145    Clinical Impression Statement  Patient is having less pain in the pelvic area. Patient has improved posture after manual work and improved lower rib cage mobility. Patient has tightness in the diaphgram and left upper abdomen. End of treatment patient was able to do 360 degree breathing to epand the rib cage. Patient will sit with flexed posture at the mid thoracic area. Patient will benefit from skilled therapy to elongate her tissue improve posture, imporve strength  and reduce vaginal pain.    Personal Factors and Comorbidities  Comorbidity 2;Sex    Comorbidities  Abdominal hysterectomy, c-section, fibroid    Examination-Activity Limitations  Toileting    Examination-Participation Restrictions  Interpersonal Relationship;Community Activity    Stability/Clinical Decision Making  Evolving/Moderate complexity    Rehab Potential  Good    PT Frequency  1x / week    PT Duration  12 weeks    PT Treatment/Interventions  Cryotherapy;Electrical Stimulation;Moist Heat;Ultrasound;Neuromuscular  re-education;Therapeutic exercise;Therapeutic activities;Patient/family education;Manual techniques;Dry needling;Taping;Spinal Manipulations    PT Next Visit Plan  work on fascial release along the inner thigh, , lumbar and thoracic mobilization, fascial release around the vulva; press and release around the vulva, pelvic meditation    PT Home Exercise Plan  Access Code: [email or web address]    Consulted and Agree with Plan of Care  Patient       Patient will benefit from skilled therapeutic intervention in order to improve the following deficits and impairments:  Decreased coordination, Pain, Increased muscle spasms, Increased fascial restricitons, Decreased strength, Decreased mobility  Visit Diagnosis: Muscle weakness (generalized)  Cramp and spasm  Pelvic pain     Problem List Patient Active Problem List   Diagnosis Date Noted  . Situational depression 03/22/2014  . Cough 09/06/2013  . Hyperlipidemia 09/06/2013  . Fibroadenoma of right breast 05/26/2012  . Chronic pelvic pain in female 12/23/2011  . Hypothyroidism, acquired, autoimmune   . Thyroiditis, autoimmune   . Goiter   . Fatigue   . Vitamin D deficiency disease   . Dyspepsia   . Goiter, nontoxic, multinodular   . Anxiety 09/25/2011  . Ovarian cyst, right   . Constipation   . Acute cholecystitis 07/16/2011  . Thyroid disease   . Elevated cholesterol   . Fibroid   . Vulvodynia   . Pure hypercholesterolemia 03/12/2011    Earlie Counts, PT 03/28/20 11:49 AM   [facility] [address] [place], [place], [address] Phone: [phone]   Fax:  [phone]  Name: [name] MRN: [id number] Date of Birth: 22-Mar-1966

## 2020-04-02 ENCOUNTER — Encounter: Payer: Self-pay | Admitting: Physical Therapy

## 2020-04-02 ENCOUNTER — Ambulatory Visit: Payer: 59 | Admitting: Physical Therapy

## 2020-04-02 ENCOUNTER — Other Ambulatory Visit: Payer: Self-pay

## 2020-04-02 DIAGNOSIS — M6281 Muscle weakness (generalized): Secondary | ICD-10-CM | POA: Diagnosis not present

## 2020-04-02 DIAGNOSIS — R252 Cramp and spasm: Secondary | ICD-10-CM

## 2020-04-02 DIAGNOSIS — R102 Pelvic and perineal pain: Secondary | ICD-10-CM

## 2020-04-02 NOTE — Therapy (Signed)
[facility] [address] [place], [place], [address] Phone: [phone]   Fax:  [phone]  Physical Therapy Treatment  Patient Details  Name: [name] MRN: [id number] Date of Birth: [birth year] Referring Provider (PT): Dr. Lindell Noe   Encounter Date: 04/02/2020  PT End of Session - 04/02/20 1242    Visit Number  3    Date for PT Re-Evaluation  05/16/20    Authorization Type  [employer]    Authorization - Visit Number  3    Authorization - Number of Visits  23    PT Start Time  1100    PT Stop Time  1200    PT Time Calculation (min)  60 min    Activity Tolerance  Patient tolerated treatment well;No increased pain    Behavior During Therapy  WFL for tasks assessed/performed       Past Medical History:  Diagnosis Date  . Cholecystitis chronic, acute   . Constipation   . Dyspepsia   . Elevated cholesterol   . Fatigue   . Fibroid   . GERD (gastroesophageal reflux disease)   . Goiter, nontoxic, multinodular   . Hypothyroidism, acquired, autoimmune   . Ovarian cyst, right   . Thyroid disease    Hypothyroid  . Thyroiditis, autoimmune   . Vitamin D deficiency disease   . Vulvodynia     Past Surgical History:  Procedure Laterality Date  . ABDOMINAL HYSTERECTOMY     TAH  . CESAREAN SECTION    . TEMPOROMANDIBULAR JOINT SURGERY      There were no vitals filed for this visit.  Subjective Assessment - 04/02/20 1103    Subjective  Yesterday the burning became bad.    Patient Stated Goals  stop hurting    Currently in Pain?  Yes    Pain Score  8     Pain Location  Vagina    Pain Orientation  Mid    Pain Descriptors / Indicators  Burning    Pain Type  Acute pain    Pain Onset  More than a month ago    Pain Frequency  Constant    Aggravating Factors   feeling to go to the bathroom    Pain Relieving Factors  ice packs    Multiple Pain Sites  No                     Pelvic Floor  Special Questions - 04/02/20 0001    Pelvic Floor Internal Exam  Patient confirms identification and approves PT to assess pelvic floor and treatment    Exam Type  Vaginal        [facility] Adult PT Treatment/Exercise - 04/02/20 0001      Lumbar Exercises: Stretches   Hip Flexor Stretch  Right;Left;1 rep;60 seconds    Hip Flexor Stretch Limitations  with foam roll    Piriformis Stretch  Right;Left;1 rep;60 seconds    Piriformis Stretch Limitations  using the foam roll    Other Lumbar Stretch Exercise  lay along the spine with foam roll to elongate the spine, stretch the pectoralis and alternate shoulder flexion to increase extension of the thoracic      Modalities   Modalities  Electrical Stimulation;Cryotherapy      Cryotherapy   Number Minutes Cryotherapy  15 Minutes    Cryotherapy Location  Other (comment)   vaginal   Type of Cryotherapy  Ice pack  Acupuncturist Location  --   inner thigh and lower abdominals   Electrical Stimulation Action  IFC    Electrical Stimulation Parameters  15 min, to patient tolerance    Electrical Stimulation Goals  Tone;Pain      Manual Therapy   Manual Therapy  Joint mobilization;Myofascial release    Joint Mobilization  anterior lower rib cage ; PA and rotational mobilization to T4-L5;    Myofascial Release  inner thigh and vulvar area to reduce her burining               PT Short Term Goals - 04/02/20 1312      PT SHORT TERM GOAL #1   Title  independent with initial HEP    Time  4    Period  Weeks    Status  On-going      PT SHORT TERM GOAL #2   Title  burning pain with daily activities decreased by 25%    Baseline  worse today    Time  4    Period  Weeks    Status  On-going      PT SHORT TERM GOAL #3   Title  pelvis in correct alignment to reduce strain on the pelvic floor    Time  4    Period  Weeks    Target Date  03/21/20      PT SHORT TERM GOAL #4   Title  -------         PT Long Term Goals - 02/22/20 1618      PT LONG TERM GOAL #1   Title  independent with HEP and understand how to progress herself    Baseline  ---    Time  12    Period  Weeks    Status  New    Target Date  05/16/20      PT LONG TERM GOAL #2   Title  pain with daily activities decreased >/= 75%    Baseline  --    Period  Weeks    Status  New    Target Date  05/16/20      PT LONG TERM GOAL #3   Title  understand how to correct her posture to reduce strain on pelvic floor with reduction of kyphosis and posterior pelvic tilt    Time  12    Period  Weeks    Status  New    Target Date  05/16/20      PT LONG TERM GOAL #4   Title  fatique with daily activities decreased >/= 75% due to decreased pain and increased energy    Baseline  ----    Time  12    Period  Weeks    Status  New    Target Date  05/16/20      PT LONG TERM GOAL #5   Title  able to have vaginal penetration with no more than 1-2/10 pain due to decreased irritation of the vulva and healthier tissue    Time  12    Period  Weeks    Status  New    Target Date  05/16/20            Plan - 04/02/20 1243    Clinical Impression Statement  Patient was having increased in buring in the vulva area today. After manual work the burning decreased. Patient tried the electrical stimulation to see if it helps with management of her pain.  Patient continues to have tightness in the mid thoracic with increased flexion. Patient was able to tolerate the fascial release around the vulvar area. Patient will benefit from skilled therapy to elongate her tissue, improve strength, and redcue vaginal pain.    Personal Factors and Comorbidities  Comorbidity 2;Sex    Comorbidities  Abdominal hysterectomy, c-section, fibroid    Examination-Activity Limitations  Toileting    Examination-Participation Restrictions  Interpersonal Relationship;Community Activity    Stability/Clinical Decision Making  Evolving/Moderate complexity     Rehab Potential  Good    PT Frequency  1x / week    PT Duration  12 weeks    PT Treatment/Interventions  Cryotherapy;Electrical Stimulation;Moist Heat;Ultrasound;Neuromuscular re-education;Therapeutic exercise;Therapeutic activities;Patient/family education;Manual techniques;Dry needling;Taping;Spinal Manipulations    PT Next Visit Plan  check pelvic alignment; work on fascial release along the inner thigh, , lumbar and thoracic mobilization, fascial release around the vulva; press and release around the vulva, pelvic meditation; see if the stim helped    PT Home Exercise Plan  Access Code: [email or web address]    Consulted and Agree with Plan of Care  Patient       Patient will benefit from skilled therapeutic intervention in order to improve the following deficits and impairments:  Decreased coordination, Pain, Increased muscle spasms, Increased fascial restricitons, Decreased strength, Decreased mobility  Visit Diagnosis: Muscle weakness (generalized)  Cramp and spasm  Pelvic pain     Problem List Patient Active Problem List   Diagnosis Date Noted  . Situational depression 03/22/2014  . Cough 09/06/2013  . Hyperlipidemia 09/06/2013  . Fibroadenoma of right breast 05/26/2012  . Chronic pelvic pain in female 12/23/2011  . Hypothyroidism, acquired, autoimmune   . Thyroiditis, autoimmune   . Goiter   . Fatigue   . Vitamin D deficiency disease   . Dyspepsia   . Goiter, nontoxic, multinodular   . Anxiety 09/25/2011  . Ovarian cyst, right   . Constipation   . Acute cholecystitis 07/16/2011  . Thyroid disease   . Elevated cholesterol   . Fibroid   . Vulvodynia   . Pure hypercholesterolemia 03/12/2011    Earlie Counts, PT 04/02/20 1:15 PM   [facility] [address] [place], [place], [address] Phone: [phone]   Fax:  [phone]  Name: [name] MRN: [id number] Date of Birth: [birth year]

## 2020-04-11 ENCOUNTER — Encounter: Payer: Self-pay | Admitting: Physical Therapy

## 2020-04-11 ENCOUNTER — Other Ambulatory Visit: Payer: Self-pay

## 2020-04-11 ENCOUNTER — Ambulatory Visit: Payer: 59 | Admitting: Physical Therapy

## 2020-04-11 DIAGNOSIS — R102 Pelvic and perineal pain: Secondary | ICD-10-CM

## 2020-04-11 DIAGNOSIS — R252 Cramp and spasm: Secondary | ICD-10-CM

## 2020-04-11 DIAGNOSIS — M6281 Muscle weakness (generalized): Secondary | ICD-10-CM | POA: Diagnosis not present

## 2020-04-11 NOTE — Therapy (Signed)
[facility] [address] [place], [place], [address] Phone: [phone]   Fax:  [phone]  Physical Therapy Treatment  Patient Details  Name: [name] MRN: [id number] Date of Birth: [birth year] Referring Provider (PT): Dr. Lindell Noe   Encounter Date: 04/11/2020  PT End of Session - 04/11/20 1155    Visit Number  4    Date for PT Re-Evaluation  05/16/20    Authorization Type  UHC    Authorization - Visit Number  4    Authorization - Number of Visits  23    PT Start Time  1100    PT Stop Time  [id number]    PT Time Calculation (min)  65 min    Activity Tolerance  Patient tolerated treatment well;No increased pain    Behavior During Therapy  WFL for tasks assessed/performed       Past Medical History:  Diagnosis Date  . Cholecystitis chronic, acute   . Constipation   . Dyspepsia   . Elevated cholesterol   . Fatigue   . Fibroid   . GERD (gastroesophageal reflux disease)   . Goiter, nontoxic, multinodular   . Hypothyroidism, acquired, autoimmune   . Ovarian cyst, right   . Thyroid disease    Hypothyroid  . Thyroiditis, autoimmune   . Vitamin D deficiency disease   . Vulvodynia     Past Surgical History:  Procedure Laterality Date  . ABDOMINAL HYSTERECTOMY     TAH  . CESAREAN SECTION    . TEMPOROMANDIBULAR JOINT SURGERY      There were no vitals filed for this visit.  Subjective Assessment - 04/11/20 1104    Subjective  I still have the burning and it is the same. My back is feeling better in the thoracic area. Feeling like she has to go to the bathroom is 60% better. I do the meditation every few days. Not sure if stim helped.    Patient Stated Goals  stop hurting    Currently in Pain?  Yes    Pain Score  8     Pain Location  Vagina    Pain Orientation  Mid    Pain Descriptors / Indicators  Burning    Pain Type  Acute pain    Pain Onset  More than a month ago    Pain Frequency  Constant     Aggravating Factors   feeling to go to the bathoom    Pain Relieving Factors  ice packs    Multiple Pain Sites  No                     Pelvic Floor Special Questions - 04/11/20 0001    Pelvic Floor Internal Exam  Patient confirms identification and approves PT to assess pelvic floor and treatment    Exam Type  Vaginal        [facility] Adult PT Treatment/Exercise - 04/11/20 0001      Modalities   Modalities  Electrical Stimulation;Cryotherapy      Cryotherapy   Number Minutes Cryotherapy  15 Minutes    Cryotherapy Location  Other (comment)   vaginal   Type of Cryotherapy  Ice pack      Electrical Stimulation   Electrical Stimulation Location  --   inner thigh and lower abdominals   Electrical Stimulation Action  IFC    Electrical Stimulation Parameters  15 min, to patient tolerance    Electrical Stimulation Goals  Tone;Pain      Manual Therapy   Manual Therapy  Joint mobilization;Myofascial release;Internal Pelvic Floor    Joint Mobilization  anterior lower rib cage ; PA and rotational mobilization to T4-L5;    Myofascial Release  around the vuvla, and labial majora while using desert harvest reveleum    Internal Pelvic Floor  release around the introitus and bulbocavernsosus using desert harvest reveleum               PT Short Term Goals - 04/02/20 1312      PT SHORT TERM GOAL #1   Title  independent with initial HEP    Time  4    Period  Weeks    Status  On-going      PT SHORT TERM GOAL #2   Title  burning pain with daily activities decreased by 25%    Baseline  worse today    Time  4    Period  Weeks    Status  On-going      PT SHORT TERM GOAL #3   Title  pelvis in correct alignment to reduce strain on the pelvic floor    Time  4    Period  Weeks    Target Date  03/21/20      PT SHORT TERM GOAL #4   Title  -------        PT Long Term Goals - 02/22/20 1618      PT LONG TERM GOAL #1   Title  independent with HEP and understand  how to progress herself    Baseline  ---    Time  12    Period  Weeks    Status  New    Target Date  05/16/20      PT LONG TERM GOAL #2   Title  pain with daily activities decreased >/= 75%    Baseline  --    Period  Weeks    Status  New    Target Date  05/16/20      PT LONG TERM GOAL #3   Title  understand how to correct her posture to reduce strain on pelvic floor with reduction of kyphosis and posterior pelvic tilt    Time  12    Period  Weeks    Status  New    Target Date  05/16/20      PT LONG TERM GOAL #4   Title  fatique with daily activities decreased >/= 75% due to decreased pain and increased energy    Baseline  ----    Time  12    Period  Weeks    Status  New    Target Date  05/16/20      PT LONG TERM GOAL #5   Title  able to have vaginal penetration with no more than 1-2/10 pain due to decreased irritation of the vulva and healthier tissue    Time  12    Period  Weeks    Status  New    Target Date  05/16/20            Plan - 04/11/20 1156    Clinical Impression Statement  Patient had decreased burning of the vulva after the manual work. Patient urge to void and frequency to urniate has improved by 60%. Patient stands wit hless thoracic kyphosis. Patient will benefit from skilled therapy to elongate her tissue, improve strength, and reduce vaginal pain.    Personal Factors and Comorbidities  Comorbidity 2;Sex    Comorbidities  Abdominal hysterectomy, c-section, fibroid    Examination-Activity Limitations  Toileting    Examination-Participation Restrictions  Interpersonal Relationship;Community Activity    Stability/Clinical Decision Making  Evolving/Moderate complexity    Rehab Potential  Good    PT Frequency  1x / week    PT Duration  12 weeks    PT Treatment/Interventions  Cryotherapy;Electrical Stimulation;Moist Heat;Ultrasound;Neuromuscular re-education;Therapeutic exercise;Therapeutic activities;Patient/family education;Manual techniques;Dry  needling;Taping;Spinal Manipulations    PT Next Visit Plan  check pelvic alignment, work on fascial release around the vulva, and introitus; see if stim helps    PT Home Exercise Plan  Access Code: [email or web address]    Recommended Other Services  MD signed initial note    Consulted and Agree with Plan of Care  Patient       Patient will benefit from skilled therapeutic intervention in order to improve the following deficits and impairments:  Decreased coordination, Pain, Increased muscle spasms, Increased fascial restricitons, Decreased strength, Decreased mobility  Visit Diagnosis: Muscle weakness (generalized)  Cramp and spasm  Pelvic pain     Problem List Patient Active Problem List   Diagnosis Date Noted  . Situational depression 03/22/2014  . Cough 09/06/2013  . Hyperlipidemia 09/06/2013  . Fibroadenoma of right breast 05/26/2012  . Chronic pelvic pain in female 12/23/2011  . Hypothyroidism, acquired, autoimmune   . Thyroiditis, autoimmune   . Goiter   . Fatigue   . Vitamin D deficiency disease   . Dyspepsia   . Goiter, nontoxic, multinodular   . Anxiety 09/25/2011  . Ovarian cyst, right   . Constipation   . Acute cholecystitis 07/16/2011  . Thyroid disease   . Elevated cholesterol   . Fibroid   . Vulvodynia   . Pure hypercholesterolemia 03/12/2011    Earlie Counts, PT 04/11/20 1:17 PM   [facility] [address] [place], [place], [address] Phone: [phone]   Fax:  [phone]  Name: [name] MRN: [id number] Date of Birth: [birth year]

## 2020-04-25 ENCOUNTER — Encounter: Payer: 59 | Admitting: Physical Therapy

## 2020-05-02 ENCOUNTER — Ambulatory Visit: Payer: 59 | Attending: Obstetrics & Gynecology | Admitting: Physical Therapy

## 2020-05-02 ENCOUNTER — Encounter: Payer: Self-pay | Admitting: Physical Therapy

## 2020-05-02 ENCOUNTER — Other Ambulatory Visit: Payer: Self-pay

## 2020-05-02 DIAGNOSIS — M6281 Muscle weakness (generalized): Secondary | ICD-10-CM | POA: Insufficient documentation

## 2020-05-02 DIAGNOSIS — R252 Cramp and spasm: Secondary | ICD-10-CM | POA: Diagnosis present

## 2020-05-02 DIAGNOSIS — R102 Pelvic and perineal pain: Secondary | ICD-10-CM | POA: Insufficient documentation

## 2020-05-02 NOTE — Therapy (Signed)
[facility] [address] [place], [place], [address] Phone: [phone]   Fax:  [phone]  Physical Therapy Treatment  Patient Details  Name: [name] MRN: [id number] Date of Birth: [birth year] Referring Provider (PT): Dr. Lindell Noe   Encounter Date: 05/02/2020   PT End of Session - 05/02/20 1106    Visit Number 5    Date for PT Re-Evaluation 05/16/20    Authorization Type U[employer]    Authorization - Visit Number 5    Authorization - Number of Visits 23    PT Start Time 1100    PT Stop Time 1140    PT Time Calculation (min) 40 min    Activity Tolerance Patient tolerated treatment well;No increased pain    Behavior During Therapy WFL for tasks assessed/performed           Past Medical History:  Diagnosis Date  . Cholecystitis chronic, acute   . Constipation   . Dyspepsia   . Elevated cholesterol   . Fatigue   . Fibroid   . GERD (gastroesophageal reflux disease)   . Goiter, nontoxic, multinodular   . Hypothyroidism, acquired, autoimmune   . Ovarian cyst, right   . Thyroid disease    Hypothyroid  . Thyroiditis, autoimmune   . Vitamin D deficiency disease   . Vulvodynia     Past Surgical History:  Procedure Laterality Date  . ABDOMINAL HYSTERECTOMY     TAH  . CESAREAN SECTION    . TEMPOROMANDIBULAR JOINT SURGERY      There were no vitals filed for this visit.   Subjective Assessment - 05/02/20 1103    Subjective I saw the MD. I had trigger point injections and they have helped. She wants me to come to therapy for 12 more weeks.    Patient Stated Goals stop hurting    Currently in Pain? Yes    Pain Score 3     Pain Location Vagina    Pain Orientation Mid    Pain Descriptors / Indicators Burning    Pain Type Acute pain    Pain Onset More than a month ago    Pain Frequency Constant    Aggravating Factors  feeling to go to the bathroom    Pain Relieving Factors ie packs               [facility] PT Assessment - 05/02/20 0001      Palpation   SI assessment  ASIS is equal                      Pelvic Floor Special Questions - 05/02/20 0001    Pelvic Floor Internal Exam Patient confirms identification and approves PT to assess pelvic floor and treatment    Exam Type Vaginal    Palpation tenderness located on the levator ani, vulvar had ertthemia             [facility] Adult PT Treatment/Exercise - 05/02/20 0001      Lumbar Exercises: Stretches   Other Lumbar Stretch Exercise lay along the foam roll to elongate the spine while being asessed      Manual Therapy   Manual Therapy Myofascial release;Internal Pelvic Floor    Myofascial Release fascial release around the clitorus, between the labia minora and majora, along the vulvar area    Internal Pelvic Floor internal fascial release to the levator ani muscles while monitoring for pain and feeling the releases, going gently  and monitor for pain                    PT Short Term Goals - 05/02/20 1157      PT SHORT TERM GOAL #1   Title independent with initial HEP    Time 4    Period Weeks    Status Achieved      PT SHORT TERM GOAL #2   Title burning pain with daily activities decreased by 25%    Baseline after pudendal nerve block    Time 4    Period Weeks    Status Achieved      PT SHORT TERM GOAL #3   Title pelvis in correct alignment to reduce strain on the pelvic floor    Time 4    Period Weeks    Status Achieved             PT Long Term Goals - 02/22/20 1618      PT LONG TERM GOAL #1   Title independent with HEP and understand how to progress herself    Baseline ---    Time 12    Period Weeks    Status New    Target Date 05/16/20      PT LONG TERM GOAL #2   Title pain with daily activities decreased >/= 75%    Baseline --    Period Weeks    Status New    Target Date 05/16/20      PT LONG TERM GOAL #3   Title understand how to correct her posture to reduce  strain on pelvic floor with reduction of kyphosis and posterior pelvic tilt    Time 12    Period Weeks    Status New    Target Date 05/16/20      PT LONG TERM GOAL #4   Title fatique with daily activities decreased >/= 75% due to decreased pain and increased energy    Baseline ----    Time 12    Period Weeks    Status New    Target Date 05/16/20      PT LONG TERM GOAL #5   Title able to have vaginal penetration with no more than 1-2/10 pain due to decreased irritation of the vulva and healthier tissue    Time 12    Period Weeks    Status New    Target Date 05/16/20                 Plan - 05/02/20 1110    Clinical Impression Statement Patient had a pudendal nerve block 2 weeks ago and pain has decreased from 8/10 to 3/10. Patient vulvar area had erythemia and after manual work decreased to a light pink. After manual work, patient reports increased in burning feeling. Paient is standing up straighter. Pelvis in correct alignent. Patient will benefit from skilled therapy to elongate her tissue, improve strength, and reduce vaginal pain    Personal Factors and Comorbidities Comorbidity 2;Sex    Comorbidities Abdominal hysterectomy, c-section, fibroid    Examination-Activity Limitations Toileting    Examination-Participation Restrictions Interpersonal Relationship;Community Activity    Stability/Clinical Decision Making Evolving/Moderate complexity    Rehab Potential Good    PT Frequency 1x / week    PT Duration 12 weeks    PT Treatment/Interventions Cryotherapy;Electrical Stimulation;Moist Heat;Ultrasound;Neuromuscular re-education;Therapeutic exercise;Therapeutic activities;Patient/family education;Manual techniques;Dry needling;Taping;Spinal Manipulations    PT Next Visit Plan write renewal; continue with fascial work on the vulvar area and  internally since she had the trigger point injection; Patient will be leaving for [facility] after next appoinment therefore go over home  program    PT Home Exercise Plan Access Code: [email or web address]    Consulted and Agree with Plan of Care Patient           Patient will benefit from skilled therapeutic intervention in order to improve the following deficits and impairments:  Decreased coordination, Pain, Increased muscle spasms, Increased fascial restricitons, Decreased strength, Decreased mobility  Visit Diagnosis: Muscle weakness (generalized)  Cramp and spasm  Pelvic pain     Problem List Patient Active Problem List   Diagnosis Date Noted  . Situational depression 03/22/2014  . Cough 09/06/2013  . Hyperlipidemia 09/06/2013  . Fibroadenoma of right breast 05/26/2012  . Chronic pelvic pain in female 12/23/2011  . Hypothyroidism, acquired, autoimmune   . Thyroiditis, autoimmune   . Goiter   . Fatigue   . Vitamin D deficiency disease   . Dyspepsia   . Goiter, nontoxic, multinodular   . Anxiety 09/25/2011  . Ovarian cyst, right   . Constipation   . Acute cholecystitis 07/16/2011  . Thyroid disease   . Elevated cholesterol   . Fibroid   . Vulvodynia   . Pure hypercholesterolemia 03/12/2011    Earlie Counts, PT 05/02/20 11:59 AM   [facility] [address], [place], [place], [address] Phone: [phone]   Fax:  [phone]  Name: CHYANE GREER MRN: [id number] Date of Birth: 1966-01-18

## 2020-05-09 ENCOUNTER — Other Ambulatory Visit: Payer: Self-pay

## 2020-05-09 ENCOUNTER — Encounter: Payer: Self-pay | Admitting: Physical Therapy

## 2020-05-09 ENCOUNTER — Ambulatory Visit: Payer: 59 | Admitting: Physical Therapy

## 2020-05-09 DIAGNOSIS — M6281 Muscle weakness (generalized): Secondary | ICD-10-CM

## 2020-05-09 DIAGNOSIS — R252 Cramp and spasm: Secondary | ICD-10-CM

## 2020-05-09 DIAGNOSIS — R102 Pelvic and perineal pain: Secondary | ICD-10-CM

## 2020-05-09 NOTE — Therapy (Signed)
[facility] [address] [place], [place], [address] Phone: [phone]   Fax:  [phone]  Physical Therapy Treatment  Patient Details  Name: [name] MRN: [id number] Date of Birth: 1966/01/22 Referring Provider (PT): Dr. Lindell Noe   Encounter Date: 05/09/2020   PT End of Session - 05/09/20 1147    Visit Number 6    Date for PT Re-Evaluation 08/01/20    Authorization Type UHC    Authorization - Visit Number 6    Authorization - Number of Visits 23    PT Start Time 1100    PT Stop Time 1140    PT Time Calculation (min) 40 min    Activity Tolerance Patient tolerated treatment well;No increased pain    Behavior During Therapy WFL for tasks assessed/performed           Past Medical History:  Diagnosis Date  . Cholecystitis chronic, acute   . Constipation   . Dyspepsia   . Elevated cholesterol   . Fatigue   . Fibroid   . GERD (gastroesophageal reflux disease)   . Goiter, nontoxic, multinodular   . Hypothyroidism, acquired, autoimmune   . Ovarian cyst, right   . Thyroid disease    Hypothyroid  . Thyroiditis, autoimmune   . Vitamin D deficiency disease   . Vulvodynia     Past Surgical History:  Procedure Laterality Date  . ABDOMINAL HYSTERECTOMY     TAH  . CESAREAN SECTION    . TEMPOROMANDIBULAR JOINT SURGERY      There were no vitals filed for this visit.   Subjective Assessment - 05/09/20 1101    Subjective I am busy and leaving for tomorrow. I have been doing well till today. I feel the injections have helped.    Patient Stated Goals stop hurting    Currently in Pain? Yes    Pain Score 5     Pain Location Vagina    Pain Orientation Mid    Pain Descriptors / Indicators Burning    Pain Type Acute pain    Pain Onset More than a month ago    Pain Frequency Constant    Aggravating Factors  feeling to go to the bathroom    Pain Relieving Factors ice packs    Multiple Pain Sites No               [facility] PT Assessment - 05/09/20 0001      Assessment   Medical Diagnosis N94.89 High-tone pelvic floor dysfunction; R10., G89.29 Chronic pelvic pain in female    Referring Provider (PT) Dr. Lindell Noe    Onset Date/Surgical Date 02/08/20    Prior Therapy yes      Precautions   Precautions Anterior Hip      Restrictions   Weight Bearing Restrictions No      Max residence      Cognition   Overall Cognitive Status Within Functional Limits for tasks assessed      Posture/Postural Control   Posture/Postural Control Postural limitations    Postural Limitations Rounded Shoulders;Forward head;Increased thoracic kyphosis;Posterior pelvic tilt      ROM / Strength   AROM / PROM / Strength AROM;PROM;Strength      AROM   Lumbar Flexion full    Lumbar Extension decreased by 25%    Lumbar - Right Side Bend full    Lumbar - Left Side Bend full    Lumbar - Right  Rotation decreased by 25%    Lumbar - Left Rotation decreased by 25%      Strength   Right Hip Flexion 4-/5    Right Hip Extension 5/5    Right Hip External Rotation  5/5    Right Hip Internal Rotation 5/5    Right Hip ABduction 3/5    Right Hip ADduction 4/5    Left Hip Flexion 4/5    Left Hip Extension 4+/5    Left Hip External Rotation 5/5    Left Hip Internal Rotation 5/5    Left Hip ABduction 3/5    Left Hip ADduction 4/5      Palpation   SI assessment  ASIS is equal                      Pelvic Floor Special Questions - 05/09/20 0001    Urinary Leakage No    Urinary urgency Yes   when has to go needs to get to bathroom   Fecal incontinence No    Prolapse None    Pelvic Floor Internal Exam Patient confirms identification and approves PT to assess pelvic floor and treatment    Exam Type Vaginal    Palpation tenderness located on the ATLA, posterior vaginal    Strength fair squeeze, definite lift             [facility] Adult PT  Treatment/Exercise - 05/09/20 0001      Lumbar Exercises: Stretches   Other Lumbar Stretch Exercise lay along the foam roll to elongate the spine while being asessed    Other Lumbar Stretch Exercise lay on foam roll to mobilize the thoracic area      Manual Therapy   Manual Therapy Internal Pelvic Floor    Internal Pelvic Floor gentle soft tissue work to the ATLA, obturator internist, post. pelvic floor while monitoring for pain and fascial releases; bulging of the pelvic floor  with breath and therapist feeling the introitus open up                  PT Education - 05/09/20 1154    Education Details gave patient samples of [employer]    [employer]) Educated Patient            PT Short Term Goals - 05/02/20 1157      PT SHORT TERM GOAL #1   Title independent with initial HEP    Time 4    Period Weeks    Status Achieved      PT SHORT TERM GOAL #2   Title burning pain with daily activities decreased by 25%    Baseline after pudendal nerve block    Time 4    Period Weeks    Status Achieved      PT SHORT TERM GOAL #3   Title pelvis in correct alignment to reduce strain on the pelvic floor    Time 4    Period Weeks    Status Achieved             PT Long Term Goals - 05/09/20 1146      PT LONG TERM GOAL #1   Title independent with HEP and understand how to progress herself    Time 12    Period Weeks    Status On-going      PT LONG TERM GOAL #2   Title pain with daily activities decreased >/= 75%    Time 12  Period Weeks    Status On-going      PT LONG TERM GOAL #3   Title understand how to correct her posture to reduce strain on pelvic floor with reduction of kyphosis and posterior pelvic tilt    Time 12    Period Weeks    Status Achieved      PT LONG TERM GOAL #4   Title fatique with daily activities decreased >/= 75% due to decreased pain and increased energy    Time 12    Period Weeks    Status On-going      PT LONG TERM GOAL #5    Title able to have vaginal penetration with no more than 1-2/10 pain due to decreased irritation of the vulva and healthier tissue    Time 12    Period Weeks    Status On-going                 Plan - 05/09/20 1148    Clinical Impression Statement Patient pain level has decreased significantly since she has had the trigger point injections. Patient is able to tolerate the therapist performing interal soft tissue work to the pelvic floor and the therapist is able to feel the fascial releases. Patient is aware of the posture and understand how to correct it. Patient has improve lumbar ROM and bilateral rotation is decreased by 50%. Patient has weakenss in the hip abductors and adductors. Paient has not mentioned if penile penetration is less pain. She is tight in the internal rotators. Patient will benefit from skilled therapy to elongate her tissue, improve strength, and reduce vaginal pain.    Personal Factors and Comorbidities Comorbidity 2;Sex    Comorbidities Abdominal hysterectomy, c-section, fibroid    Examination-Activity Limitations Toileting    Examination-Participation Restrictions Interpersonal Relationship;Community Activity    Stability/Clinical Decision Making Evolving/Moderate complexity    Rehab Potential Good    PT Frequency 1x / week    PT Duration 12 weeks    PT Treatment/Interventions Cryotherapy;Electrical Stimulation;Moist Heat;Ultrasound;Neuromuscular re-education;Therapeutic exercise;Therapeutic activities;Patient/family education;Manual techniques;Dry needling;Taping;Spinal Manipulations    PT Next Visit Plan work with IR to elongate the pelvic floor, exercise to open the rib cage anteriorly, continue with internal manual work, hip abductor and adductor strength    PT Home Exercise Plan Access Code: [email or web address]    Consulted and Agree with Plan of Care Patient           Patient will benefit from skilled therapeutic intervention in order to improve the following  deficits and impairments:  Decreased coordination, Pain, Increased muscle spasms, Increased fascial restricitons, Decreased strength, Decreased mobility  Visit Diagnosis: Muscle weakness (generalized) - Plan: PT plan of care cert/re-cert  Cramp and spasm - Plan: PT plan of care cert/re-cert  Pelvic pain - Plan: PT plan of care cert/re-cert     Problem List Patient Active Problem List   Diagnosis Date Noted  . Situational depression 03/22/2014  . Cough 09/06/2013  . Hyperlipidemia 09/06/2013  . Fibroadenoma of right breast 05/26/2012  . Chronic pelvic pain in female 12/23/2011  . Hypothyroidism, acquired, autoimmune   . Thyroiditis, autoimmune   . Goiter   . Fatigue   . Vitamin D deficiency disease   . Dyspepsia   . Goiter, nontoxic, multinodular   . Anxiety 09/25/2011  . Ovarian cyst, right   . Constipation   . Acute cholecystitis 07/16/2011  . Thyroid disease   . Elevated cholesterol   . Fibroid   . Vulvodynia   .  Pure hypercholesterolemia 03/12/2011    Earlie Counts, PT 05/09/20 11:57 AM   [facility] [address] [place], [place], [address] Phone: [phone]   Fax:  [phone]  Name: DYLANA SHAW MRN: [id number] Date of Birth: [birth year]

## 2020-05-17 ENCOUNTER — Encounter: Payer: 59 | Admitting: Physical Therapy

## 2020-05-24 ENCOUNTER — Other Ambulatory Visit: Payer: Self-pay

## 2020-05-24 ENCOUNTER — Encounter: Payer: Self-pay | Admitting: Physical Therapy

## 2020-05-24 ENCOUNTER — Ambulatory Visit: Payer: 59 | Attending: Obstetrics & Gynecology | Admitting: Physical Therapy

## 2020-05-24 DIAGNOSIS — M6281 Muscle weakness (generalized): Secondary | ICD-10-CM | POA: Insufficient documentation

## 2020-05-24 DIAGNOSIS — R252 Cramp and spasm: Secondary | ICD-10-CM | POA: Insufficient documentation

## 2020-05-24 DIAGNOSIS — R102 Pelvic and perineal pain: Secondary | ICD-10-CM | POA: Diagnosis present

## 2020-05-24 NOTE — Therapy (Signed)
[facility] [address], [place], [place], [address] Phone: [phone]   Fax:  [phone]  Physical Therapy Treatment  Patient Details  Name: [name] MRN: [id number] Date of Birth: 1966-08-08 Referring Provider (PT): Dr. Lindell Noe   Encounter Date: 05/24/2020   PT End of Session - 05/24/20 1232    Visit Number 7    Date for PT Re-Evaluation 08/01/20    Authorization Type UHC    Authorization - Visit Number 7    Authorization - Number of Visits 23    PT Start Time 7209    PT Stop Time 1308    PT Time Calculation (min) 38 min    Activity Tolerance Patient tolerated treatment well;No increased pain    Behavior During Therapy WFL for tasks assessed/performed           Past Medical History:  Diagnosis Date  . Cholecystitis chronic, acute   . Constipation   . Dyspepsia   . Elevated cholesterol   . Fatigue   . Fibroid   . GERD (gastroesophageal reflux disease)   . Goiter, nontoxic, multinodular   . Hypothyroidism, acquired, autoimmune   . Ovarian cyst, right   . Thyroid disease    Hypothyroid  . Thyroiditis, autoimmune   . Vitamin D deficiency disease   . Vulvodynia     Past Surgical History:  Procedure Laterality Date  . ABDOMINAL HYSTERECTOMY     TAH  . CESAREAN SECTION    . TEMPOROMANDIBULAR JOINT SURGERY      There were no vitals filed for this visit.   Subjective Assessment - 05/24/20 1233    Subjective I have had some very bad days. Sometimes I feel worse 2 days after therapy. I have some good days. when I do Pilates I feel good. I do pilates 1x per week.    Patient Stated Goals stop hurting    Currently in Pain? Yes    Pain Score 4     Pain Location Vagina    Pain Orientation Mid    Pain Descriptors / Indicators Burning    Pain Type Acute pain    Pain Onset More than a month ago    Pain Frequency Constant    Aggravating Factors  feeling to go to the bathroom    Pain  Relieving Factors ice packs    Multiple Pain Sites No                          Pelvic Floor Special Questions - 05/24/20 0001    Pelvic Floor Internal Exam Patient confirms identification and approves PT to assess pelvic floor and treatment    Exam Type Vaginal             [facility] Adult PT Treatment/Exercise - 05/24/20 0001      Self-Care   Self-Care Other Self-Care Comments    Other Self-Care Comments  dicussed reasons whey she may feel worse 2 days after therapy      Manual Therapy   Manual Therapy Internal Pelvic Floor    Internal Pelvic Floor posteriror and right pelvic floor muscles working through fascial releases and moving the right hip in different directions while monitorin for pain                    PT Short Term Goals - 05/02/20 1157      PT SHORT TERM GOAL #1   Title  independent with initial HEP    Time 4    Period Weeks    Status Achieved      PT SHORT TERM GOAL #2   Title burning pain with daily activities decreased by 25%    Baseline after pudendal nerve block    Time 4    Period Weeks    Status Achieved      PT SHORT TERM GOAL #3   Title pelvis in correct alignment to reduce strain on the pelvic floor    Time 4    Period Weeks    Status Achieved             PT Long Term Goals - 05/24/20 1318      PT LONG TERM GOAL #1   Title independent with HEP and understand how to progress herself    Time 12    Period Weeks    Status On-going      PT LONG TERM GOAL #2   Title pain with daily activities decreased >/= 75%    Time 12    Period Weeks    Status On-going      PT LONG TERM GOAL #3   Title understand how to correct her posture to reduce strain on pelvic floor with reduction of kyphosis and posterior pelvic tilt    Time 12    Period Weeks    Status Achieved      PT LONG TERM GOAL #4   Title fatique with daily activities decreased >/= 75% due to decreased pain and increased energy    Time 12    Period Weeks     Status On-going      PT LONG TERM GOAL #5   Title able to have vaginal penetration with no more than 1-2/10 pain due to decreased irritation of the vulva and healthier tissue    Time 12    Period Weeks    Status On-going                 Plan - 05/24/20 1232    Clinical Impression Statement Patient has not met goals today. She feels good after therapy but will hurt 2 days later. Patient is reducing the intesity of her stretches to see if that helps. When goes home today, she will try an ice pack in the vaginal canal to see if that helps. Patient will benefit from skilled therapy to elongate her tissue, improve strength, and reduce vaginal pain.    Personal Factors and Comorbidities Comorbidity 2;Sex    Comorbidities Abdominal hysterectomy, c-section, fibroid    Examination-Activity Limitations Toileting    Examination-Participation Restrictions Interpersonal Relationship;Community Activity    Stability/Clinical Decision Making Evolving/Moderate complexity    Rehab Potential Good    PT Frequency 1x / week    PT Duration 12 weeks    PT Treatment/Interventions Cryotherapy;Electrical Stimulation;Moist Heat;Ultrasound;Neuromuscular re-education;Therapeutic exercise;Therapeutic activities;Patient/family education;Manual techniques;Dry needling;Taping;Spinal Manipulations    PT Next Visit Plan see if there is a difference from the right and left pelvic floor, see if she is doing meditation, continue with internal work    PT Home Exercise Plan Access Code: [email or web address]    Recommended Other Services MD signed all notes    Consulted and Agree with Plan of Care Patient           Patient will benefit from skilled therapeutic intervention in order to improve the following deficits and impairments:  Decreased coordination, Pain, Increased muscle spasms, Increased fascial restricitons, Decreased strength, Decreased mobility  Visit Diagnosis: Muscle weakness (generalized)  Cramp and  spasm  Pelvic pain     Problem List Patient Active Problem List   Diagnosis Date Noted  . Situational depression 03/22/2014  . Cough 09/06/2013  . Hyperlipidemia 09/06/2013  . Fibroadenoma of right breast 05/26/2012  . Chronic pelvic pain in female 12/23/2011  . Hypothyroidism, acquired, autoimmune   . Thyroiditis, autoimmune   . Goiter   . Fatigue   . Vitamin D deficiency disease   . Dyspepsia   . Goiter, nontoxic, multinodular   . Anxiety 09/25/2011  . Ovarian cyst, right   . Constipation   . Acute cholecystitis 07/16/2011  . Thyroid disease   . Elevated cholesterol   . Fibroid   . Vulvodynia   . Pure hypercholesterolemia 03/12/2011    Earlie Counts, PT 05/24/20 1:19 PM    [facility] [address] [place], [place], [address] Phone: [phone]   Fax:  [phone]  Name: [name] MRN: [id number] Date of Birth: [birth year]

## 2020-05-28 ENCOUNTER — Encounter: Payer: 59 | Admitting: Physical Therapy

## 2020-06-04 ENCOUNTER — Encounter: Payer: Self-pay | Admitting: Physical Therapy

## 2020-06-04 ENCOUNTER — Ambulatory Visit: Payer: 59 | Admitting: Physical Therapy

## 2020-06-04 ENCOUNTER — Other Ambulatory Visit: Payer: Self-pay

## 2020-06-04 DIAGNOSIS — R252 Cramp and spasm: Secondary | ICD-10-CM

## 2020-06-04 DIAGNOSIS — M6281 Muscle weakness (generalized): Secondary | ICD-10-CM | POA: Diagnosis not present

## 2020-06-04 DIAGNOSIS — R102 Pelvic and perineal pain: Secondary | ICD-10-CM

## 2020-06-04 NOTE — Therapy (Addendum)
[facility] [address] [place], [place], [address] Phone: [phone]   Fax:  [phone]  Physical Therapy Treatment  Patient Details  Name: [name] MRN: [id number] Date of Birth: 1966-02-21 Referring Provider (PT): Dr. Lindell Noe   Encounter Date: 06/04/2020   PT End of Session - 06/04/20 1058    Visit Number 8    Date for PT Re-Evaluation 08/01/20    Authorization Type UHC    Authorization - Visit Number 8    Authorization - Number of Visits 23    PT Start Time 1100    PT Stop Time 1140    PT Time Calculation (min) 40 min    Activity Tolerance Patient tolerated treatment well;No increased pain    Behavior During Therapy WFL for tasks assessed/performed           Past Medical History:  Diagnosis Date   Cholecystitis chronic, acute    Constipation    Dyspepsia    Elevated cholesterol    Fatigue    Fibroid    GERD (gastroesophageal reflux disease)    Goiter, nontoxic, multinodular    Hypothyroidism, acquired, autoimmune    Ovarian cyst, right    Thyroid disease    Hypothyroid   Thyroiditis, autoimmune    Vitamin D deficiency disease    Vulvodynia     Past Surgical History:  Procedure Laterality Date   ABDOMINAL HYSTERECTOMY     TAH   CESAREAN SECTION     TEMPOROMANDIBULAR JOINT SURGERY      There were no vitals filed for this visit.   Subjective Assessment - 06/04/20 1100    Subjective I am having trouble with hemorroids.  They hurt really bad. When I travel it messes up my system.    Patient Stated Goals stop hurting    Currently in Pain? Yes    Pain Score 2     Pain Location Vagina    Pain Orientation Mid    Pain Descriptors / Indicators Burning    Pain Onset More than a month ago    Pain Frequency Constant    Aggravating Factors  feeling to go to the bathroom    Pain Relieving Factors ice packs    Multiple Pain Sites Yes    Pain Score 9    Pain  Location Rectum    Pain Orientation Mid    Pain Descriptors / Indicators Constant    Pain Type Acute pain    Pain Onset More than a month ago    Pain Frequency Constant    Aggravating Factors  pressure on the area    Pain Relieving Factors reduce pressure              [facility] PT Assessment - 06/04/20 0001      Palpation   Palpation comment hemorroids were located around the rectum outside, patient reports they are firm                      Pelvic Floor Special Questions - 06/04/20 0001    Pelvic Floor Internal Exam Patient confirms identification and approves PT to assess pelvic floor and treatment    Exam Type Vaginal             [facility] Adult PT Treatment/Exercise - 06/04/20 0001      Manual Therapy   Manual Therapy Internal Pelvic Floor    Internal Pelvic Floor fascial release along the posterior and lateral introitus ,  release around the clitorus, around the urethera and toward the rectal area                    PT Short Term Goals - 05/02/20 1157      PT SHORT TERM GOAL #1   Title independent with initial HEP    Time 4    Period Weeks    Status Achieved      PT SHORT TERM GOAL #2   Title burning pain with daily activities decreased by 25%    Baseline after pudendal nerve block    Time 4    Period Weeks    Status Achieved      PT SHORT TERM GOAL #3   Title pelvis in correct alignment to reduce strain on the pelvic floor    Time 4    Period Weeks    Status Achieved             PT Long Term Goals - 06/04/20 1147      PT LONG TERM GOAL #1   Title independent with HEP and understand how to progress herself    Time 12    Period Weeks    Status On-going      PT LONG TERM GOAL #2   Title pain with daily activities decreased >/= 75%    Baseline pain level 2/10    Time 12    Period Weeks    Status On-going      PT LONG TERM GOAL #3   Title understand how to correct her posture to reduce strain on pelvic floor with reduction  of kyphosis and posterior pelvic tilt    Time 12    Period Weeks    Status Achieved      PT LONG TERM GOAL #4   Title fatique with daily activities decreased >/= 75% due to decreased pain and increased energy    Time 12    Period Weeks      PT LONG TERM GOAL #5   Title able to have vaginal penetration with no more than 1-2/10 pain due to decreased irritation of the vulva and healthier tissue                 Plan - 06/04/20 1059    Clinical Impression Statement Patient has hemorrods that are at a pain leve 9/10 making her sit more on one gluteal due to pain. Patient vaginal pain is 2/10 and has been doing well.She had fascial releases in the introitus and along the clitorus and rectal area. Patient is doing well and her vaginal area is not in as much pain. There is some redness in the introitus. Patient is standing with increased erect posture and seems very relaxed. Patient will benefit from skilled therapy to elongate her tissue, improve strength, and reduce vaginal pain.    Personal Factors and Comorbidities Comorbidity 2;Sex    Comorbidities Abdominal hysterectomy, c-section, fibroid    Examination-Activity Limitations Toileting    Examination-Participation Restrictions Interpersonal Relationship;Community Activity    Stability/Clinical Decision Making Evolving/Moderate complexity    Rehab Potential Good    PT Frequency 1x / week    PT Duration 12 weeks    PT Treatment/Interventions Cryotherapy;Electrical Stimulation;Moist Heat;Ultrasound;Neuromuscular re-education;Therapeutic exercise;Therapeutic activities;Patient/family education;Manual techniques;Dry needling;Taping;Spinal Manipulations    PT Next Visit Plan see if there is a difference from the right and left pelvic floor, , continue with internal work; if patient is doing the well the day of treatment she may  cancel to save her visits.    PT Home Exercise Plan Access Code: [email or web address]    Consulted and Agree with Plan of Care  Patient           Patient will benefit from skilled therapeutic intervention in order to improve the following deficits and impairments:  Decreased coordination, Pain, Increased muscle spasms, Increased fascial restricitons, Decreased strength, Decreased mobility  Visit Diagnosis: Muscle weakness (generalized)  Cramp and spasm  Pelvic pain     Problem List Patient Active Problem List   Diagnosis Date Noted   Situational depression 03/22/2014   Cough 09/06/2013   Hyperlipidemia 09/06/2013   Fibroadenoma of right breast 05/26/2012   Chronic pelvic pain in female 12/23/2011   Hypothyroidism, acquired, autoimmune    Thyroiditis, autoimmune    Goiter    Fatigue    Vitamin D deficiency disease    Dyspepsia    Goiter, nontoxic, multinodular    Anxiety 09/25/2011   Ovarian cyst, right    Constipation    Acute cholecystitis 07/16/2011   Thyroid disease    Elevated cholesterol    Fibroid    Vulvodynia    Pure hypercholesterolemia 03/12/2011    Earlie Counts, PT 06/04/20 11:48 AM   [facility] [address], [place], [place], [address] Phone: [phone]   Fax:  [phone]  Name: [name] MRN: [id number] Date of Birth: 1965/12/24  PHYSICAL THERAPY DISCHARGE SUMMARY  Visits from Start of Care: 8  Current functional level related to goals / functional outcomes: See above. Patient has cancelled her last 2 visits without a reason and has not rescheduled for further visits.    Remaining deficits: See above.    Education / Equipment: HEP Plan: Patient agrees to discharge.  Patient goals were not met. Patient is being discharged due to not returning since the last visit. Thank you for the referral. Earlie Counts, PT 08/13/20 9:17 AM   ?????

## 2020-06-11 ENCOUNTER — Ambulatory Visit: Payer: 59 | Admitting: Physical Therapy

## 2020-07-30 ENCOUNTER — Other Ambulatory Visit (HOSPITAL_COMMUNITY): Payer: Self-pay | Admitting: Gastroenterology

## 2020-07-30 DIAGNOSIS — R6881 Early satiety: Secondary | ICD-10-CM

## 2020-07-30 DIAGNOSIS — R935 Abnormal findings on diagnostic imaging of other abdominal regions, including retroperitoneum: Secondary | ICD-10-CM

## 2020-08-01 ENCOUNTER — Ambulatory Visit: Payer: 59 | Admitting: Physical Therapy

## 2020-08-10 ENCOUNTER — Ambulatory Visit (HOSPITAL_COMMUNITY)
Admission: RE | Admit: 2020-08-10 | Discharge: 2020-08-10 | Disposition: A | Discharge status: Home / Self Care | Payer: 59 | Source: Ambulatory Visit | Attending: Gastroenterology | Admitting: Gastroenterology

## 2020-08-10 DIAGNOSIS — R935 Abnormal findings on diagnostic imaging of other abdominal regions, including retroperitoneum: Secondary | ICD-10-CM | POA: Diagnosis not present

## 2020-08-10 DIAGNOSIS — R6881 Early satiety: Secondary | ICD-10-CM | POA: Insufficient documentation

## 2020-08-10 MED ORDER — TECHNETIUM TC 99M SULFUR COLLOID
2.0000 | Freq: Once | INTRAVENOUS | Status: AC | PRN
  Administered 2020-08-10: 2 via INTRAVENOUS

## 2020-08-22 ENCOUNTER — Ambulatory Visit (HOSPITAL_COMMUNITY): Payer: 59

## 2020-09-13 ENCOUNTER — Other Ambulatory Visit: Payer: Self-pay | Admitting: Gastroenterology

## 2020-09-13 DIAGNOSIS — K824 Cholesterolosis of gallbladder: Secondary | ICD-10-CM

## 2020-09-28 ENCOUNTER — Ambulatory Visit
Admission: RE | Admit: 2020-09-28 | Discharge: 2020-09-28 | Disposition: A | Discharge status: Home / Self Care | Payer: 59 | Source: Ambulatory Visit | Attending: Gastroenterology | Admitting: Gastroenterology

## 2020-09-28 DIAGNOSIS — K824 Cholesterolosis of gallbladder: Secondary | ICD-10-CM

## 2020-11-18 IMAGING — US US ABDOMEN LIMITED
1 series · 13 of 25 positions shown · non-contrast
Comparison: Multiple prior studies most recent of 09/03/2018 in
terms of ultrasound, ultrasounds dating back to 0920 and recent MR
evaluations.

CLINICAL DATA: History of gallbladder polyps.

EXAM:
ULTRASOUND ABDOMEN LIMITED RIGHT UPPER QUADRANT

[Series 1: us abdomen limited · 0.19mm/px · 13 of 46 slices shown]
[im 1/46]
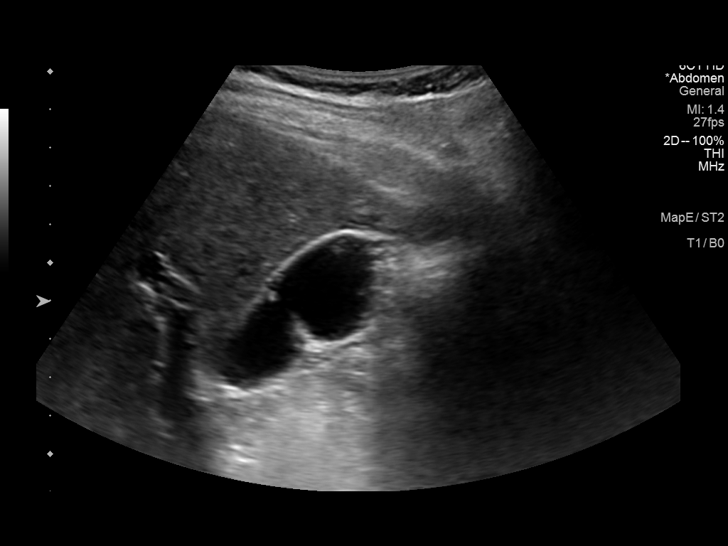
[im 4/46]
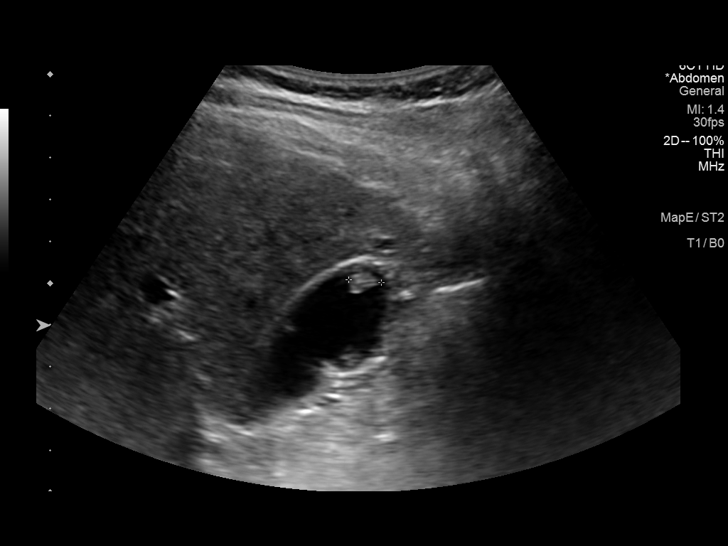
[im 8/46]
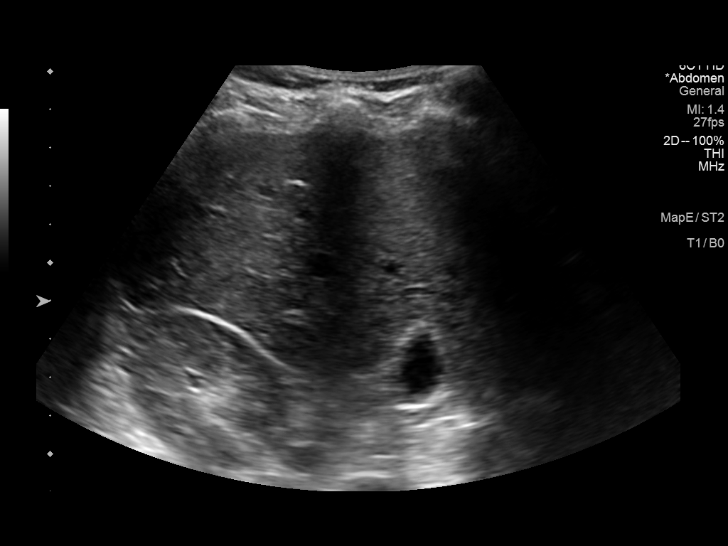
[im 12/46]
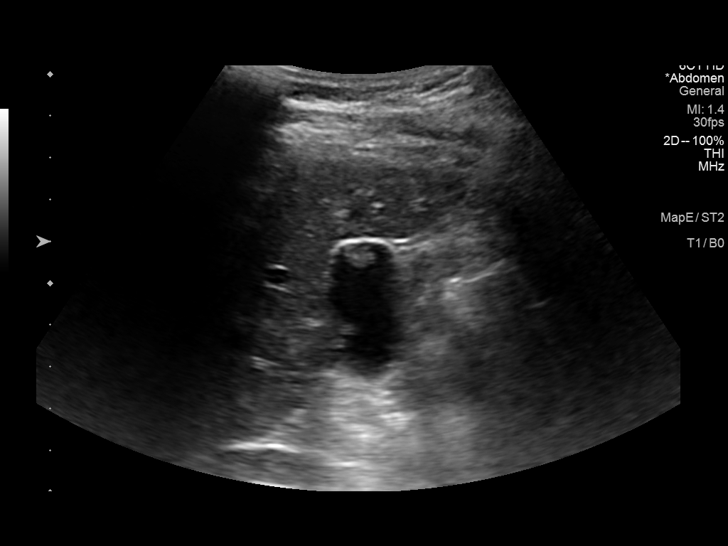
[im 16/46]
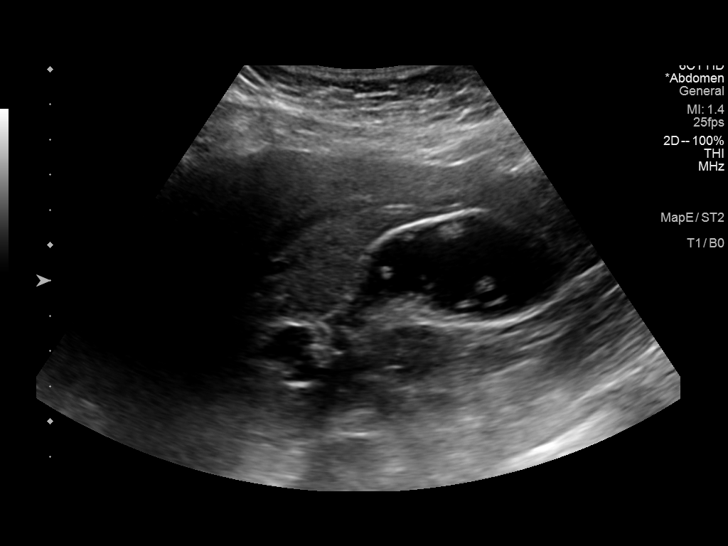
[im 19/46]
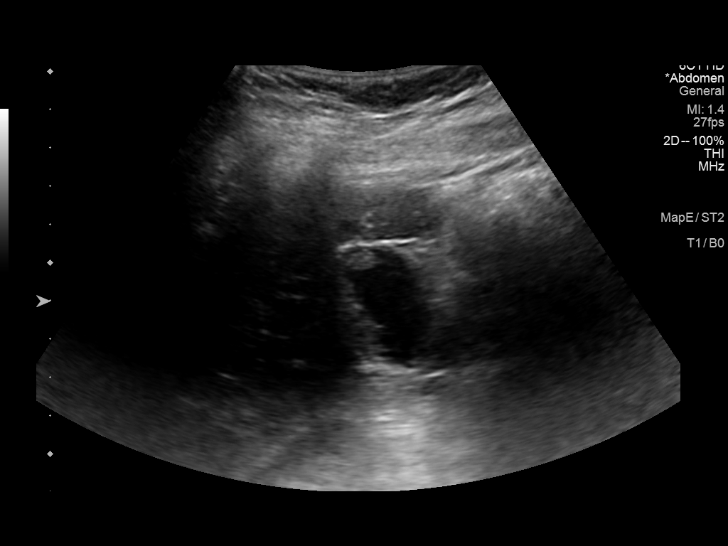
[im 23/46]
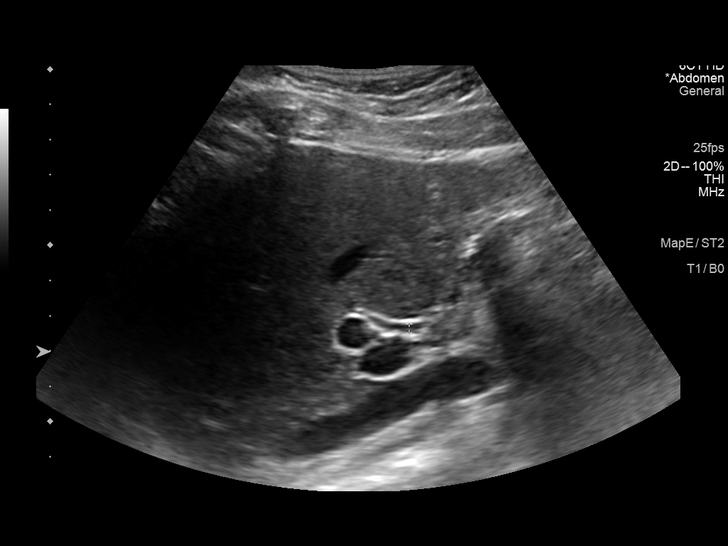
[im 27/46]
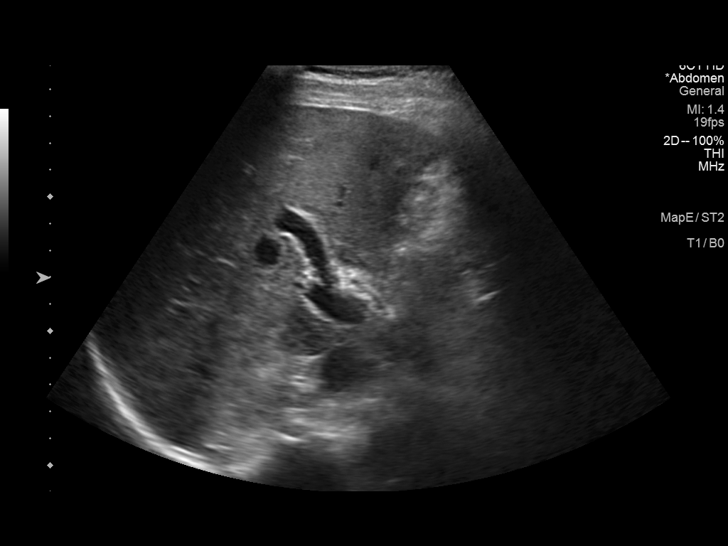
[im 31/46]
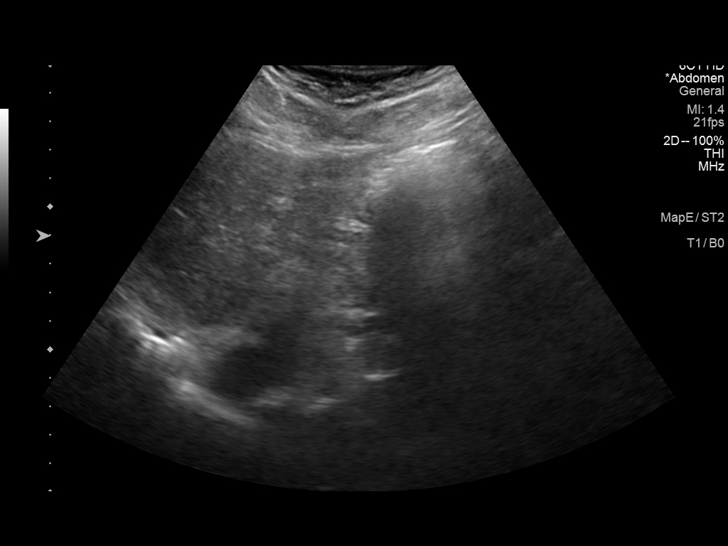
[im 34/46]
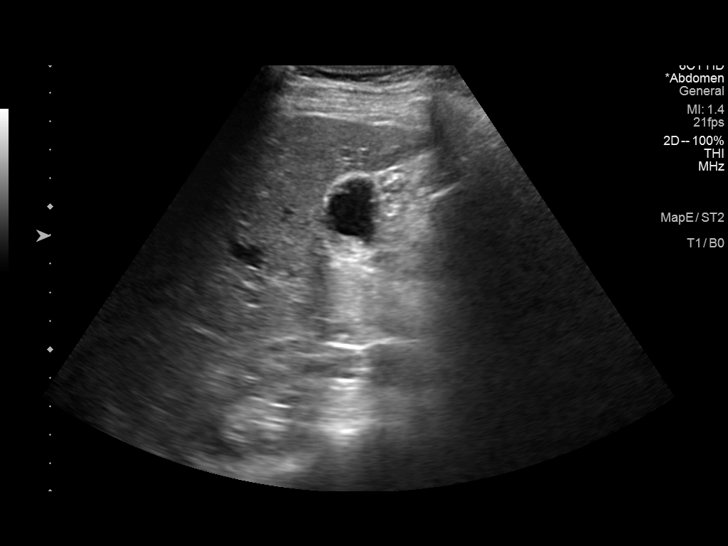
[im 38/46]
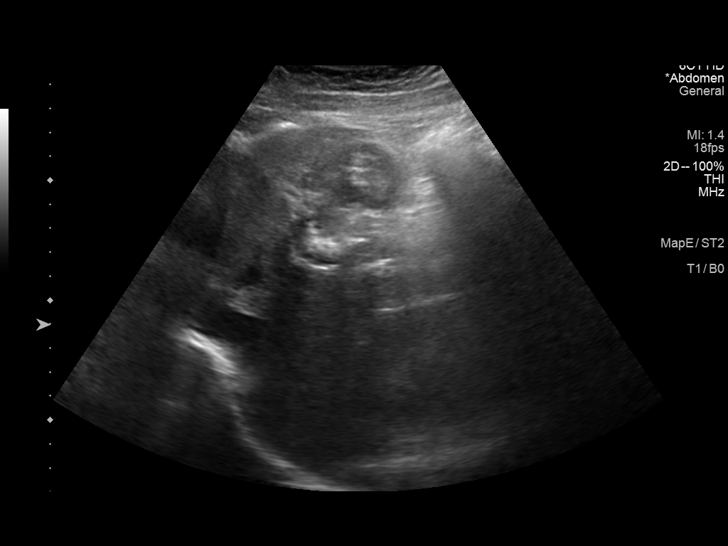
[im 42/46]
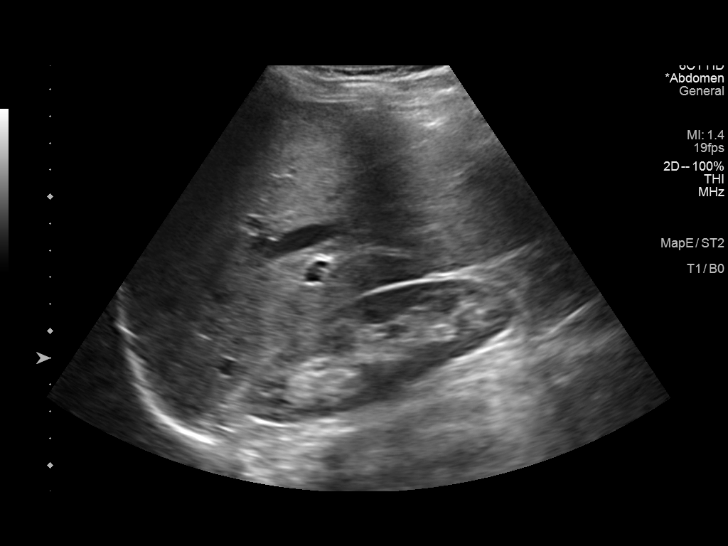
[im 46/46]
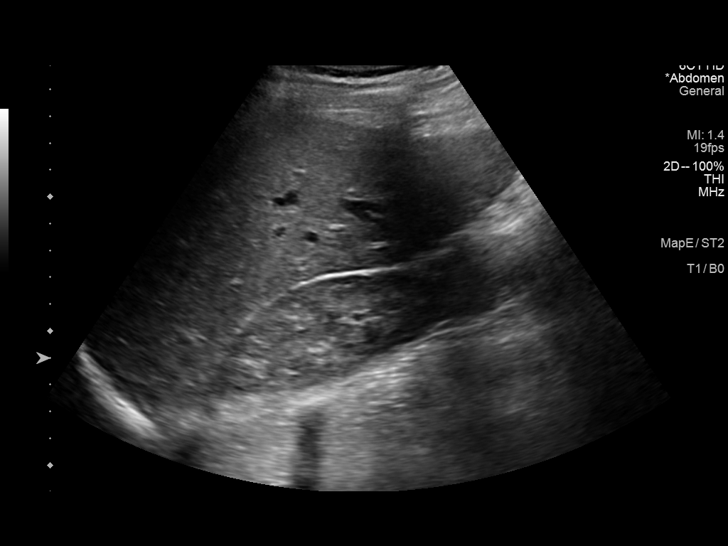

[13 of 25 positions shown; findings below may reference images not displayed]

FINDINGS: Gallbladder:

Nodular areas along the wall of the gallbladder are again noted and
are unchanged dating back to 7409 and only minimally changed,
perhaps by 1 mm as compared to the study of 08/19/2011. Largest area
measuring 9 mm x 5 mm as compared to 9 mm x 4 mm in Friday August, 2018.
Multiple smaller foci of mildly echogenic nonshadowing peripheral
areas are noted. No signs of diffuse gallbladder wall thickening. No
reported sonographic Murphy sign.

Common bile duct:

Diameter: 3 mm

Liver:

No focal lesion identified. Within normal limits in parenchymal
echogenicity. Portal vein is patent on color Doppler imaging with
normal direction of blood flow towards the liver.

Other: None
IMPRESSION: Small gallbladder polyps not significantly changed since 0920 and
stable within 1 mm since 7409. Based on size small adenoma or even
neoplasm is in the differential but not favored given stability over
time. Annual surveillance with ultrasound is suggested.

No acute findings.

## 2021-04-24 ENCOUNTER — Other Ambulatory Visit: Payer: Self-pay | Admitting: Internal Medicine

## 2021-04-24 DIAGNOSIS — E039 Hypothyroidism, unspecified: Secondary | ICD-10-CM

## 2021-05-03 ENCOUNTER — Other Ambulatory Visit: Payer: 59

## 2021-05-08 ENCOUNTER — Ambulatory Visit
Admission: RE | Admit: 2021-05-08 | Discharge: 2021-05-08 | Disposition: A | Discharge status: Home / Self Care | Payer: 59 | Source: Ambulatory Visit | Attending: Internal Medicine | Admitting: Internal Medicine

## 2021-05-08 DIAGNOSIS — E039 Hypothyroidism, unspecified: Secondary | ICD-10-CM

## 2021-10-15 ENCOUNTER — Other Ambulatory Visit: Payer: Self-pay | Admitting: Gastroenterology

## 2021-10-15 DIAGNOSIS — K824 Cholesterolosis of gallbladder: Secondary | ICD-10-CM

## 2021-10-15 DIAGNOSIS — K760 Fatty (change of) liver, not elsewhere classified: Secondary | ICD-10-CM

## 2021-10-17 ENCOUNTER — Ambulatory Visit
Admission: RE | Admit: 2021-10-17 | Discharge: 2021-10-17 | Disposition: A | Discharge status: Home / Self Care | Payer: 59 | Source: Ambulatory Visit | Attending: Gastroenterology | Admitting: Gastroenterology

## 2021-10-17 ENCOUNTER — Other Ambulatory Visit: Payer: Self-pay | Admitting: Gastroenterology

## 2021-10-17 DIAGNOSIS — K760 Fatty (change of) liver, not elsewhere classified: Secondary | ICD-10-CM

## 2021-10-17 DIAGNOSIS — K824 Cholesterolosis of gallbladder: Secondary | ICD-10-CM

## 2021-10-21 ENCOUNTER — Other Ambulatory Visit (HOSPITAL_COMMUNITY): Payer: Self-pay | Admitting: Internal Medicine

## 2021-11-06 ENCOUNTER — Ambulatory Visit (HOSPITAL_BASED_OUTPATIENT_CLINIC_OR_DEPARTMENT_OTHER)
Admission: RE | Admit: 2021-11-06 | Discharge: 2021-11-06 | Disposition: A | Discharge status: Home / Self Care | Payer: 59 | Source: Ambulatory Visit | Attending: Internal Medicine | Admitting: Internal Medicine

## 2021-11-06 ENCOUNTER — Other Ambulatory Visit: Payer: Self-pay

## 2021-11-06 DIAGNOSIS — E785 Hyperlipidemia, unspecified: Secondary | ICD-10-CM | POA: Insufficient documentation

## 2021-12-31 ENCOUNTER — Encounter (HOSPITAL_COMMUNITY): Payer: Self-pay | Admitting: Gastroenterology

## 2022-01-08 ENCOUNTER — Ambulatory Visit (HOSPITAL_COMMUNITY)
Admission: RE | Admit: 2022-01-08 | Discharge: 2022-01-08 | Disposition: A | Discharge status: Home / Self Care | Payer: 59 | Attending: Gastroenterology | Admitting: Gastroenterology

## 2022-01-08 ENCOUNTER — Encounter (HOSPITAL_COMMUNITY): Payer: Self-pay | Admitting: Gastroenterology

## 2022-01-08 ENCOUNTER — Encounter (HOSPITAL_COMMUNITY)
Admission: RE | Disposition: A | Discharge status: Home / Self Care | Payer: Self-pay | Source: Home / Self Care | Attending: Gastroenterology

## 2022-01-08 DIAGNOSIS — R1310 Dysphagia, unspecified: Secondary | ICD-10-CM | POA: Insufficient documentation

## 2022-01-08 HISTORY — PX: ESOPHAGEAL MANOMETRY: SHX5429

## 2022-01-08 SURGERY — MANOMETRY, ESOPHAGUS

## 2022-01-08 MED ORDER — LIDOCAINE VISCOUS HCL 2 % MT SOLN
OROMUCOSAL | Status: AC
  Filled 2022-01-08: qty 15

## 2022-01-08 SURGICAL SUPPLY — 2 items
FACESHIELD LNG OPTICON STERILE (SAFETY) IMPLANT
GLOVE BIO SURGEON STRL SZ8 (GLOVE) ×4 IMPLANT

## 2022-01-08 NOTE — Progress Notes (Signed)
Esophageal manometry performed per protocol.  Patient tolerated well with no problems or complications.  Catheter placed at 52 cm at left nare.  Dr Wilford Corner notified of report to be read.

## 2022-01-09 ENCOUNTER — Encounter (HOSPITAL_COMMUNITY): Payer: Self-pay | Admitting: Gastroenterology

## 2022-10-13 ENCOUNTER — Other Ambulatory Visit: Payer: Self-pay | Admitting: Internal Medicine

## 2022-10-13 DIAGNOSIS — E039 Hypothyroidism, unspecified: Secondary | ICD-10-CM

## 2022-10-22 ENCOUNTER — Ambulatory Visit
Admission: RE | Admit: 2022-10-22 | Discharge: 2022-10-22 | Disposition: A | Discharge status: Home / Self Care | Payer: 59 | Source: Ambulatory Visit | Attending: Internal Medicine | Admitting: Internal Medicine

## 2022-10-22 DIAGNOSIS — E039 Hypothyroidism, unspecified: Secondary | ICD-10-CM

## 2023-06-19 ENCOUNTER — Other Ambulatory Visit: Payer: Self-pay | Admitting: Gastroenterology

## 2023-06-19 DIAGNOSIS — K824 Cholesterolosis of gallbladder: Secondary | ICD-10-CM

## 2023-06-19 DIAGNOSIS — K760 Fatty (change of) liver, not elsewhere classified: Secondary | ICD-10-CM

## 2023-07-01 ENCOUNTER — Ambulatory Visit
Admission: RE | Admit: 2023-07-01 | Discharge: 2023-07-01 | Disposition: A | Discharge status: Home / Self Care | Payer: 59 | Source: Ambulatory Visit | Attending: Gastroenterology | Admitting: Gastroenterology

## 2023-07-01 DIAGNOSIS — K824 Cholesterolosis of gallbladder: Secondary | ICD-10-CM

## 2023-07-01 DIAGNOSIS — K760 Fatty (change of) liver, not elsewhere classified: Secondary | ICD-10-CM

## 2023-11-18 HISTORY — PX: HEMORRHOID BANDING: SHX5850

## 2024-01-14 NOTE — Progress Notes (Signed)
 [facility] [phone]   [phone]  INITIAL CONSULT NOTE  Patient Care Team: Andi Devon, MD as PCP - General (Internal Medicine)  Hematological/Oncological History 11/23/2023: Presented to PCP after having a syncopal episode. Labs showed WBC 9.1, Hgb 14.4, Plt 456 (H). CMP unremarkable,  12/16/2023: Labs from OB/GYN, Dr. Marcelino Duster Grewal.WBC 11.3 (H), Hgb 15.0, Plt 418, ANC 7.3 (H), ALC 3.2 (H), Immature grans 0.2 (H) 01/19/2024: Establish care with [facility] Hematology  CHIEF COMPLAINTS/PURPOSE OF CONSULTATION:  Leukocytosis.  HISTORY OF PRESENTING ILLNESS:  [name] 58 y.o. female with medical history significant for GERD, thyroid disease, vitamin D deficiency presents to the hematology clinic for evaluation of leukocytosis. She is unaccompanied for this visit.   On exam today, [name] reports having noticeable fatigue recently. She continues to complete her ADLs on her own. She denies any appetite changes or noticeable weight loss. She denies nausea, vomiting or bowel habit changes. She reports that since her syncopal episode  in January 2025 she denies subsequent episodes but does notice her balance is off. She denies any falls. She reports her legs ache recently without any interference with balance. She has notice more frequent headaches without any vision changes, speech difficulties or other neurological deficits. She denies easy bruising or signs of bleeding. She denies fevers, chills, sweats, shortness of breath, chest pain or cough. She has no other complaints. Rest of the 10 point ROS is below.   MEDICAL HISTORY:  Past Medical History:  Diagnosis Date   Cholecystitis chronic, acute    Constipation    Dyspepsia    Elevated cholesterol    Fatigue    Fibroid    GERD (gastroesophageal reflux disease)    Goiter, nontoxic, multinodular    Hypothyroidism, acquired, autoimmune    Ovarian cyst, right    Thyroid disease    Hypothyroid    Thyroiditis, autoimmune    Vitamin D deficiency disease    Vulvodynia     SURGICAL HISTORY: Past Surgical History:  Procedure Laterality Date   ABDOMINAL HYSTERECTOMY     TAH   CESAREAN SECTION     ESOPHAGEAL MANOMETRY N/A 01/08/2022   Procedure: ESOPHAGEAL MANOMETRY (EM);  Surgeon: Charlott Rakes, MD;  Location: [facility];  Service: Endoscopy;  Laterality: N/A;   HEMORRHOID BANDING  11/2023   TEMPOROMANDIBULAR JOINT SURGERY      SOCIAL HISTORY: Social History   Socioeconomic History   Marital status: Married    Spouse name: Not on file   Number of children: Not on file   Years of education: Not on file   Highest education level: Not on file  Occupational History   Not on file  Tobacco Use   Smoking status: Never   Smokeless tobacco: Never  Substance and Sexual Activity   Alcohol use: Yes    Alcohol/week: 1.0 standard drink of alcohol    Types: 1 Glasses of wine per week    Comment: occasional   Drug use: No   Sexual activity: Yes    Birth control/protection: Surgical  Other Topics Concern   Not on file  Social History Narrative   Not on file   Social Drivers of Health   Financial Resource Strain: Not on file  Food Insecurity: Low Risk  (07/02/2023)   Received from [facility]   Hunger Vital Sign    Worried About Running Out of Food in the Last Year: Never true    Ran Out of Food in the Last Year: Never  true  Transportation Needs: No Transportation Needs (07/02/2023)   Received from [facility]    In the past 12 months, has lack of reliable transportation kept you from medical appointments, meetings, work or from getting things needed for daily living? : No  Physical Activity: Not on file  Stress: Not on file  Social Connections: Not on file  Intimate Partner Violence: Not on file    FAMILY HISTORY: Family History  Problem Relation Age of Onset   Thyroid disease Mother    Thyroid disease Maternal Grandmother    Thyroid  disease Sister    Diabetes Paternal Grandfather    Cancer Neg Hx     ALLERGIES:  is allergic to uribel  [meth-hyo-m bl-na phos-ph sal], biaxin [clarithromycin], clarithromycin, codeine, doxycycline, levofloxacin, penicillins, zetia [ezetimibe], and tape.  MEDICATIONS:  Current Outpatient Medications  Medication Sig Dispense Refill   ARMOUR THYROID 30 MG tablet TK 1 T PO QD  5   Ascorbic Acid (VITAMIN C) 1000 MG tablet Take 1,000 mg by mouth daily.     B Complex-Biotin-FA (B COMPLETE) TABS Take 1 tablet by mouth daily.     Cholecalciferol (VITAMIN D3) 5000 units CAPS Take 1 capsule by mouth daily. With Vit K     Docusate Sodium (COLACE PO) Take by mouth.     EC-RX Testosterone 0.4 % CREA Place onto the skin.     Estradiol (IMVEXXY MAINTENANCE PACK) 10 MCG INST Place vaginally.     estrogens, conjugated, (PREMARIN) 0.45 MG tablet Take 0.45 mg by mouth 2 (two) times a week. Take daily for 21 days then do not take for 7 days.     Lifitegrast (XIIDRA OP) Apply to eye.     MAGNESIUM OXIDE PO Take 1,000 mg by mouth every evening.     Misc Natural Products (BEET ROOT PO) Take 1,500 mg by mouth daily. 3 capsules PO daily     Omega-3 Fatty Acids (ULTRA OMEGA 3 PO) Take 2 tablets by mouth daily.     OVER THE COUNTER MEDICATION Hydro eyes 2 tabs in the pm     Probiotic Product (PROBIOTIC PO) Take 1 tablet by mouth daily.       progesterone (PROMETRIUM) 200 MG capsule Take 200 mg by mouth every evening.     tirzepatide [facility]) 5 MG/0.5ML Pen Inject 5 mg into the skin once a week.     Turmeric (QC TUMERIC COMPLEX PO) Take 1,500 mg by mouth. 2 capsules PO daily     UNABLE TO FIND Med Name: Resvoxitrol     Vonoprazan Fumarate (VOQUEZNA) 10 MG TABS Take by mouth.     aspirin EC 81 MG tablet Take 81 mg by mouth daily. (Patient not taking: Reported on 01/19/2024)     ciprofloxacin (CIPRO) 500 MG tablet ciprofloxacin 500 mg tablet (Patient not taking: Reported on 01/19/2024)     Coenzyme Q10 200 MG capsule  Take 200 mg by mouth daily. (Patient not taking: Reported on 01/19/2024)     cyclobenzaprine (FLEXERIL) 5 MG tablet Take 5 mg by mouth 3 (three) times daily as needed for muscle spasms. (Patient not taking: Reported on 01/19/2024)     esomeprazole (NEXIUM) 20 MG packet Take 20 mg by mouth daily before breakfast. (Patient not taking: Reported on 01/19/2024)     meloxicam (MOBIC) 15 MG tablet Take 15 mg by mouth daily. (Patient not taking: Reported on 01/19/2024)     Multiple Vitamin (MULTIVITAMIN) tablet Take 1 tablet by mouth daily.   (  Patient not taking: Reported on 01/19/2024)     NON FORMULARY Estriol HRT Cream 0.5 mg/1 ml  Apply 1 ml at bedtime (Patient not taking: Reported on 01/19/2024)     OVER THE COUNTER MEDICATION Triphala- 2 pills in the pm (Patient not taking: Reported on 01/19/2024)     OVER THE COUNTER MEDICATION New Tranquility 2 tabs every evening. (Patient not taking: Reported on 01/19/2024)     OVER THE COUNTER MEDICATION L-Theanine 2 capsules daily. (Patient not taking: Reported on 01/19/2024)     OVER THE COUNTER MEDICATION Adapted- All 2 tabs daily. (Patient not taking: Reported on 01/19/2024)     OVER THE COUNTER MEDICATION Dim Plus  2 tabs daily (Patient not taking: Reported on 01/19/2024)     pantoprazole (PROTONIX) 20 MG tablet Take 20 mg by mouth daily. (Patient not taking: Reported on 01/19/2024)     pregabalin (LYRICA) 25 MG capsule Take 50 mg by mouth 3 (three) times daily. (Patient not taking: Reported on 01/19/2024)     No current facility-administered medications for this visit.    REVIEW OF SYSTEMS:   Constitutional: ( - ) fevers, ( - )  chills , ( - ) night sweats Eyes: ( - ) blurriness of vision, ( - ) double vision, ( - ) watery eyes Ears, nose, mouth, throat, and face: ( - ) mucositis, ( - ) sore throat Respiratory: ( - ) cough, ( - ) dyspnea, ( - ) wheezes Cardiovascular: ( - ) palpitation, ( - ) chest discomfort, ( - ) lower extremity swelling Gastrointestinal:  ( - ) nausea, ( -  ) heartburn, ( - ) change in bowel habits Skin: ( - ) abnormal skin rashes Lymphatics: ( - ) new lymphadenopathy, ( - ) easy bruising Neurological: ( - ) numbness, ( - ) tingling, ( - ) new weaknesses Behavioral/Psych: ( - ) mood change, ( - ) new changes  All other systems were reviewed with the patient and are negative.  PHYSICAL EXAMINATION: ECOG PERFORMANCE STATUS: 1 - Symptomatic but completely ambulatory  Vitals:   01/19/24 0858  BP: 138/62  Pulse: 75  Resp: 16  Temp: 98.1 F (36.7 C)  SpO2: 100%   Filed Weights   01/19/24 0858  Weight: 140 lb 9.6 oz (63.8 kg)    GENERAL: well appearing female in NAD  SKIN: skin color, texture, turgor are normal, no rashes or significant lesions EYES: conjunctiva are pink and non-injected, sclera clear OROPHARYNX: no exudate, no erythema; lips, buccal mucosa, and tongue normal  NECK: supple, non-tender LYMPH:  no palpable lymphadenopathy in the cervical, axillary or supraclavicular lymph nodes.  LUNGS: clear to auscultation and percussion with normal breathing effort HEART: regular rate & rhythm and no murmurs and no lower extremity edema ABDOMEN: soft, non-tender, non-distended, normal bowel sounds Musculoskeletal: no cyanosis of digits and no clubbing  PSYCH: alert & oriented x 3, fluent speech NEURO: no focal motor/sensory deficits  LABORATORY DATA:  I have reviewed the data as listed    Latest Ref Rng & Units 01/19/2024    9:37 AM 03/22/2014   10:22 AM 04/20/2013   11:10 AM  CBC  WBC 4.0 - 10.5 K/uL 8.0  6.8  6.9   Hemoglobin 12.0 - 15.0 g/dL 91.4  78.2  95.6   Hematocrit 36.0 - 46.0 % 41.3  39.9  39.3   Platelets 150 - 400 K/uL 373  319  319        Latest Ref Rng & Units  01/19/2024    9:37 AM 03/22/2014   10:22 AM 11/21/2013    8:46 AM  CMP  Glucose 70 - 99 mg/dL 82  83    BUN 6 - 20 mg/dL 18  12    Creatinine 4.09 - 1.00 mg/dL 8.11  9.14    Sodium 782 - 145 mmol/L 139  138    Potassium 3.5 - 5.1 mmol/L 4.1  4.6     Chloride 98 - 111 mmol/L 108  105    CO2 22 - 32 mmol/L 27  21    Calcium 8.9 - 10.3 mg/dL 9.3  8.9  8.9   Total Protein 6.5 - 8.1 g/dL 6.4  6.0    Total Bilirubin 0.0 - 1.2 mg/dL 0.4  0.4    Alkaline Phos 38 - 126 U/L 61  49    AST 15 - 41 U/L 13  14    ALT 0 - 44 U/L 13  12     ASSESSMENT & PLAN [name] is a 58 y.o. female who presents to the hematology clinic for evaluation of leukocytosis. I reviewed possible etiologies including infectious process, inflammatory process, medication induced or underlying bone marrow condition. Most recent outside labs from her OB/GYN on 12/16/2023 showed mild leukocytosis with corresponding absolute lymphocyte and neutrophil count elevation. There is low suspicion for underlying hematologic disorder. Patient will proceed with laboratory evaluation today and I will call her by phone to review results and recommendations.   #Leukocytosis: --Seen on outside labs from 12/16/2023. WBC 11.3 (H), Hgb 15.0, Plt 418, ANC 7.3 (H), ALC 3.2 (H), Immature grans 0.2 (H) --No infectious symptoms and no known underlying inflammatory process.  --Labs today to check CBC, CMP, sed rate, CRP, ANA and peripheral smear review.  --No indication for bone marrow biopsy unless workup today suggest otherwise.   #Thrombocytosis: --Seen on outside labs from 11/23/2023 with Plt 456 (H). --Repeat CBC today and check iron panel.   #Other symptoms (fatigue, headaches, muscle aches) --Labs today to check thyroid panel due to h/o thyroid disease currently on Armour Thyroid.  --If labs are unremarkable, recommend to follow up with PCP to further evaluate symptoms.   Orders Placed This Encounter  Procedures   CBC with Differential (Cancer Center Only)    Standing Status:   Future    Number of Occurrences:   1    Expiration Date:   01/18/2025   CMP (Cancer Center only)    Standing Status:   Future    Number of Occurrences:   1    Expiration Date:   01/18/2025   Sedimentation rate     Standing Status:   Future    Number of Occurrences:   1    Expiration Date:   01/18/2025   C-reactive protein    Standing Status:   Future    Number of Occurrences:   1    Expiration Date:   01/18/2025   ANA, IFA (with reflex)    Standing Status:   Future    Number of Occurrences:   1    Expiration Date:   01/18/2025   TSH    Standing Status:   Future    Number of Occurrences:   1    Expiration Date:   01/18/2025   T4, free    Standing Status:   Future    Number of Occurrences:   1    Expiration Date:   01/18/2025   Ferritin    Standing Status:  Future    Number of Occurrences:   1    Expiration Date:   01/18/2025   Iron and Iron Binding Capacity ([facility] only)    Standing Status:   Future    Number of Occurrences:   1    Expiration Date:   01/18/2025   Technologist smear review    Clinical information::   leukocytosis    All questions were answered. The patient knows to call the clinic with any problems, questions or concerns.  I have spent a total of 60 minutes minutes of face-to-face and non-face-to-face time, preparing to see the patient, obtaining and/or reviewing separately obtained history, performing a medically appropriate examination, counseling and educating the patient, ordering tests/procedures, documenting clinical information in the electronic health record, independently interpreting results and communicating results to the patient, and care coordination.   Georga Kaufmann, PA-C Department of Hematology/Oncology [facility] Phone: [phone]

## 2024-01-19 ENCOUNTER — Inpatient Hospital Stay: Payer: 59 | Attending: Physician Assistant | Admitting: Physician Assistant

## 2024-01-19 ENCOUNTER — Inpatient Hospital Stay: Payer: 59

## 2024-01-19 ENCOUNTER — Encounter: Payer: Self-pay | Admitting: Physician Assistant

## 2024-01-19 VITALS — BP 138/62 | HR 75 | Temp 98.1°F | Resp 16 | Ht 61.5 in | Wt 140.6 lb

## 2024-01-19 DIAGNOSIS — E559 Vitamin D deficiency, unspecified: Secondary | ICD-10-CM | POA: Insufficient documentation

## 2024-01-19 DIAGNOSIS — E063 Autoimmune thyroiditis: Secondary | ICD-10-CM | POA: Diagnosis not present

## 2024-01-19 DIAGNOSIS — D72829 Elevated white blood cell count, unspecified: Secondary | ICD-10-CM

## 2024-01-19 DIAGNOSIS — D75839 Thrombocytosis, unspecified: Secondary | ICD-10-CM

## 2024-01-19 LAB — CBC WITH DIFFERENTIAL (CANCER CENTER ONLY)
Abs Immature Granulocytes: 0.03 10*3/uL (ref 0.00–0.07)
Basophils Absolute: 0.1 10*3/uL (ref 0.0–0.1)
Basophils Relative: 1 %
Eosinophils Absolute: 0.1 10*3/uL (ref 0.0–0.5)
Eosinophils Relative: 1 %
HCT: 41.3 % (ref 36.0–46.0)
Hemoglobin: 13.8 g/dL (ref 12.0–15.0)
Immature Granulocytes: 0 %
Lymphocytes Relative: 32 %
Lymphs Abs: 2.5 10*3/uL (ref 0.7–4.0)
MCH: 29.7 pg (ref 26.0–34.0)
MCHC: 33.4 g/dL (ref 30.0–36.0)
MCV: 88.8 fL (ref 80.0–100.0)
Monocytes Absolute: 0.4 10*3/uL (ref 0.1–1.0)
Monocytes Relative: 5 %
Neutro Abs: 4.9 10*3/uL (ref 1.7–7.7)
Neutrophils Relative %: 61 %
Platelet Count: 373 10*3/uL (ref 150–400)
RBC: 4.65 MIL/uL (ref 3.87–5.11)
RDW: 13.6 % (ref 11.5–15.5)
WBC Count: 8 10*3/uL (ref 4.0–10.5)
nRBC: 0 % (ref 0.0–0.2)

## 2024-01-19 LAB — CMP (CANCER CENTER ONLY)
ALT: 13 U/L (ref 0–44)
AST: 13 U/L — ABNORMAL LOW (ref 15–41)
Albumin: 4.2 g/dL (ref 3.5–5.0)
Alkaline Phosphatase: 61 U/L (ref 38–126)
Anion gap: 4 — ABNORMAL LOW (ref 5–15)
BUN: 18 mg/dL (ref 6–20)
CO2: 27 mmol/L (ref 22–32)
Calcium: 9.3 mg/dL (ref 8.9–10.3)
Chloride: 108 mmol/L (ref 98–111)
Creatinine: 0.62 mg/dL (ref 0.44–1.00)
GFR, Estimated: >60 mL/min (ref >60)
Glucose, Bld: 82 mg/dL (ref 70–99)
Potassium: 4.1 mmol/L (ref 3.5–5.1)
Sodium: 139 mmol/L (ref 135–145)
Total Bilirubin: 0.4 mg/dL (ref 0.0–1.2)
Total Protein: 6.4 g/dL — ABNORMAL LOW (ref 6.5–8.1)

## 2024-01-19 LAB — TECHNOLOGIST SMEAR REVIEW: Plt Morphology: NORMAL

## 2024-01-19 LAB — IRON AND IRON BINDING CAPACITY (CC-WL,HP ONLY)
Iron: 92 ug/dL (ref 28–170)
Saturation Ratios: 25 % (ref 10.4–31.8)
TIBC: 363 ug/dL (ref 250–450)
UIBC: 271 ug/dL (ref 148–442)

## 2024-01-19 LAB — FERRITIN: Ferritin: 11 ng/mL (ref 11–307)

## 2024-01-19 LAB — TSH: TSH: 0.488 u[IU]/mL (ref 0.350–4.500)

## 2024-01-19 LAB — C-REACTIVE PROTEIN: CRP: 0.5 mg/dL (ref <1.0)

## 2024-01-19 LAB — T4, FREE: Free T4: 0.85 ng/dL (ref 0.61–1.12)

## 2024-01-19 LAB — SEDIMENTATION RATE: Sed Rate: 3 mm/h (ref 0–22)

## 2024-01-25 ENCOUNTER — Telehealth: Payer: Self-pay | Admitting: Physician Assistant

## 2024-01-25 DIAGNOSIS — E611 Iron deficiency: Secondary | ICD-10-CM

## 2024-01-25 NOTE — Telephone Encounter (Signed)
 I spoke to [name] to review the labs from 01/19/2024 that have finalized. CBC showed blood counts are all normal without evidence of leukocytosis. Remaining workup was negative except for evidence of low iron (ferritin 11). Discussed iron supplementation with PO versus IV iron. Patient expressed having constipation with PO iron in the past so recommend IV iron to bolster iron levels.   I recommend follow up with gastroenterologist, Dr. Levora Angel from [facility] to evaluate underlying cause of low iron levels. We will forward lab results to his office for review and request for follow up.   I will see patient back in 8 weeks for labs and follow up after receiving IV iron. [name] expressed understanding of the plan provided.

## 2024-01-26 ENCOUNTER — Ambulatory Visit

## 2024-01-26 ENCOUNTER — Telehealth: Payer: Self-pay | Admitting: Physician Assistant

## 2024-01-26 ENCOUNTER — Telehealth: Payer: Self-pay

## 2024-01-26 ENCOUNTER — Encounter: Payer: Self-pay | Admitting: Physician Assistant

## 2024-01-26 VITALS — BP 126/85 | HR 69 | Temp 98.7°F | Resp 14 | Ht 61.5 in | Wt 142.2 lb

## 2024-01-26 DIAGNOSIS — E611 Iron deficiency: Secondary | ICD-10-CM | POA: Diagnosis not present

## 2024-01-26 LAB — ANTINUCLEAR ANTIBODIES, IFA: ANA Ab, IFA: NEGATIVE

## 2024-01-26 MED ORDER — IRON SUCROSE 20 MG/ML IV SOLN
200.0000 mg | Freq: Once | INTRAVENOUS | Status: AC
  Administered 2024-01-26: 200 mg via INTRAVENOUS
  Filled 2024-01-26: qty 10

## 2024-01-26 NOTE — Telephone Encounter (Signed)
 Auth Submission: NO AUTH NEEDED Site of care: Site of care: [facility] Payer: [employer] commercial Medication & CPT/J Code(s) submitted: Venofer (Iron Sucrose) J1756 Route of submission (phone, fax, portal):  Phone # Fax # Auth type: Buy/Bill PB Units/visits requested: 200mg  x 2 doses Reference number:  Approval from: 01/26/24 to 07/28/24

## 2024-01-26 NOTE — Progress Notes (Signed)
 Diagnosis: Iron Deficiency Anemia  Provider:  Chilton Greathouse MD  Procedure: IV Push  IV Type: Peripheral, IV Location: L Antecubital  Venofer (Iron Sucrose), Dose: 200 mg  Post Infusion IV Care: Observation period completed and Peripheral IV Discontinued  Discharge: Condition: Stable, Destination: Home . AVS Provided  Performed by:  Wyvonne Lenz, RN

## 2024-01-28 ENCOUNTER — Other Ambulatory Visit: Payer: Self-pay

## 2024-01-28 ENCOUNTER — Encounter: Payer: Self-pay | Admitting: Physician Assistant

## 2024-01-28 ENCOUNTER — Inpatient Hospital Stay (HOSPITAL_BASED_OUTPATIENT_CLINIC_OR_DEPARTMENT_OTHER): Admitting: Physician Assistant

## 2024-01-28 ENCOUNTER — Inpatient Hospital Stay

## 2024-01-28 VITALS — BP 121/66 | HR 70 | Temp 97.6°F | Resp 16 | Ht 61.5 in | Wt 143.1 lb

## 2024-01-28 DIAGNOSIS — E611 Iron deficiency: Secondary | ICD-10-CM | POA: Diagnosis not present

## 2024-01-28 DIAGNOSIS — D72829 Elevated white blood cell count, unspecified: Secondary | ICD-10-CM | POA: Diagnosis not present

## 2024-01-28 LAB — CBC WITH DIFFERENTIAL (CANCER CENTER ONLY)
Abs Immature Granulocytes: 0.03 10*3/uL (ref 0.00–0.07)
Basophils Absolute: 0.1 10*3/uL (ref 0.0–0.1)
Basophils Relative: 1 %
Eosinophils Absolute: 0.1 10*3/uL (ref 0.0–0.5)
Eosinophils Relative: 1 %
HCT: 42.4 % (ref 36.0–46.0)
Hemoglobin: 14.1 g/dL (ref 12.0–15.0)
Immature Granulocytes: 0 %
Lymphocytes Relative: 30 %
Lymphs Abs: 3 10*3/uL (ref 0.7–4.0)
MCH: 30.1 pg (ref 26.0–34.0)
MCHC: 33.3 g/dL (ref 30.0–36.0)
MCV: 90.6 fL (ref 80.0–100.0)
Monocytes Absolute: 0.4 10*3/uL (ref 0.1–1.0)
Monocytes Relative: 4 %
Neutro Abs: 6.6 10*3/uL (ref 1.7–7.7)
Neutrophils Relative %: 64 %
Platelet Count: 412 10*3/uL — ABNORMAL HIGH (ref 150–400)
RBC: 4.68 MIL/uL (ref 3.87–5.11)
RDW: 13.7 % (ref 11.5–15.5)
WBC Count: 10.2 10*3/uL (ref 4.0–10.5)
nRBC: 0 % (ref 0.0–0.2)

## 2024-01-28 LAB — CMP (CANCER CENTER ONLY)
ALT: 14 U/L (ref 0–44)
AST: 15 U/L (ref 15–41)
Albumin: 4.3 g/dL (ref 3.5–5.0)
Alkaline Phosphatase: 60 U/L (ref 38–126)
Anion gap: 3 — ABNORMAL LOW (ref 5–15)
BUN: 13 mg/dL (ref 6–20)
CO2: 29 mmol/L (ref 22–32)
Calcium: 8.9 mg/dL (ref 8.9–10.3)
Chloride: 107 mmol/L (ref 98–111)
Creatinine: 0.69 mg/dL (ref 0.44–1.00)
GFR, Estimated: >60 mL/min (ref >60)
Glucose, Bld: 112 mg/dL — ABNORMAL HIGH (ref 70–99)
Potassium: 4 mmol/L (ref 3.5–5.1)
Sodium: 139 mmol/L (ref 135–145)
Total Bilirubin: 0.4 mg/dL (ref 0.0–1.2)
Total Protein: 6.6 g/dL (ref 6.5–8.1)

## 2024-01-28 NOTE — Progress Notes (Signed)
 Symptom Management Consult Note [facility]    Patient Care Team: Andi Devon, MD as PCP - General (Internal Medicine)    Name / MRN / DOB: [name]  [id number]  [birth year]   Date of visit: 01/28/2024   Chief Complaint/Reason for visit: fatigue    ASSESSMENT & PLAN: Patient is a 58 y.o. female with pertinent history of iron deficiency followed by Georga Kaufmann PA-C  I have viewed most recent oncology note and lab work.    #Iron deficiency - Next infusion scheduled for 12/08/23   #Post-iron infusion symptoms - Symptoms of fatigue, lightheadedness, leg pain, cognitive impairment, and dyspnea are common post-infusion and expected to improve. No anaphylaxis or severe allergic reaction noted. Pre medications not recommended for next infusion. - Advise loratadine daily until a few days post-next infusion. - Advise against corticosteroids unless urticaria is extensive and involves the trunk. - Reassured patient that symptoms are expected and should improve. - CBC and CMP overall unremarkable.    Strict ED precautions discussed should symptoms worsen.   Heme/Onc History: Oncology History   No history exists.      Interval history-: Discussed the use of AI scribe software for clinical note transcription with the patient, who gave verbal consent to proceed.   [name] is a 58 y.o. female with oncologic history as above presenting to [facility] today with chief complaint of fatigue.Patient presents unaccompanied to visit today.  The patient presents with fatigue and lightheadedness following an iron infusion.  She experiences significant fatigue and lightheadedness following an iron infusion, feeling more exhausted than before the procedure. These symptoms have prompted her to seek medical attention, especially with an upcoming trip out of the country causing additional concern. She carries a prednisone dose pack when traveling due to past experiences  with hives. She describes aching in her legs from the knees down, which began last night, and now feels like her legs are tightening. Additionally, she is experiencing worsening brain fog. She experiences shortness of breath, describing it as feeling hard to breathe but not like she can't breathe. No wheezing. She occasionally gets hives and noticed her legs were a little itchy, but no hives have developed. She has a history of hives and typically manages them with Tagamet, Singulair, Allegra, and sometimes a prednisone shot if the hives are extensive.    ROS  All other systems are reviewed and are negative for acute change except as noted in the HPI.    Allergies  Allergen Reactions   Uribel  [Meth-Hyo-M Bl-Na Phos-Ph Sal]     Other reaction(s): Chest Pain   Biaxin [Clarithromycin]    Clarithromycin Diarrhea    Chest pain   Codeine Diarrhea    Chest pain   Doxycycline Diarrhea    Chest pain   Levofloxacin     diarrhea   Penicillins Diarrhea    Chest pain   Zetia [Ezetimibe] Other (See Comments)    Abdominal pain   Tape Rash    Sunburn like rash     Past Medical History:  Diagnosis Date   Cholecystitis chronic, acute    Constipation    Dyspepsia    Elevated cholesterol    Fatigue    Fibroid    GERD (gastroesophageal reflux disease)    Goiter, nontoxic, multinodular    Hypothyroidism, acquired, autoimmune    Ovarian cyst, right    Thyroid disease    Hypothyroid   Thyroiditis, autoimmune    Vitamin  D deficiency disease    Vulvodynia      Past Surgical History:  Procedure Laterality Date   ABDOMINAL HYSTERECTOMY     TAH   CESAREAN SECTION     ESOPHAGEAL MANOMETRY N/A 01/08/2022   Procedure: ESOPHAGEAL MANOMETRY (EM);  Surgeon: Charlott Rakes, MD;  Location: [facility];  Service: Endoscopy;  Laterality: N/A;   HEMORRHOID BANDING  11/2023   TEMPOROMANDIBULAR JOINT SURGERY      Social History   Socioeconomic History   Marital status: Married    Spouse  name: Not on file   Number of children: Not on file   Years of education: Not on file   Highest education level: Not on file  Occupational History   Not on file  Tobacco Use   Smoking status: Never   Smokeless tobacco: Never  Substance and Sexual Activity   Alcohol use: Yes    Alcohol/week: 1.0 standard drink of alcohol    Types: 1 Glasses of wine per week    Comment: occasional   Drug use: No   Sexual activity: Yes    Birth control/protection: Surgical  Other Topics Concern   Not on file  Social History Narrative   Not on file   Social Drivers of Health   Financial Resource Strain: Not on file  Food Insecurity: Low Risk  (07/02/2023)   Received from [facility]   Hunger Vital Sign    Worried About Running Out of Food in the Last Year: Never true    Ran Out of Food in the Last Year: Never true  Transportation Needs: No Transportation Needs (07/02/2023)   Received from [facility]    In the past 12 months, has lack of reliable transportation kept you from medical appointments, meetings, work or from getting things needed for daily living? : No  Physical Activity: Not on file  Stress: Not on file  Social Connections: Not on file  Intimate Partner Violence: Not on file    Family History  Problem Relation Age of Onset   Thyroid disease Mother    Thyroid disease Maternal Grandmother    Thyroid disease Sister    Diabetes Paternal Grandfather    Cancer Neg Hx      Current Outpatient Medications:    ARMOUR THYROID 30 MG tablet, TK 1 T PO QD, Disp: , Rfl: 5   Ascorbic Acid (VITAMIN C) 1000 MG tablet, Take 1,000 mg by mouth daily., Disp: , Rfl:    B Complex-Biotin-FA (B COMPLETE) TABS, Take 1 tablet by mouth daily., Disp: , Rfl:    Cholecalciferol (VITAMIN D3) 5000 units CAPS, Take 1 capsule by mouth daily. With Vit K, Disp: , Rfl:    Docusate Sodium (COLACE PO), Take by mouth., Disp: , Rfl:    EC-RX Testosterone 0.4 % CREA, Place onto the skin., Disp:  , Rfl:    Estradiol (IMVEXXY MAINTENANCE PACK) 10 MCG INST, Place vaginally., Disp: , Rfl:    estrogens, conjugated, (PREMARIN) 0.45 MG tablet, Take 0.45 mg by mouth 2 (two) times a week. Take daily for 21 days then do not take for 7 days., Disp: , Rfl:    Lifitegrast (XIIDRA OP), Apply to eye., Disp: , Rfl:    MAGNESIUM OXIDE PO, Take 1,000 mg by mouth every evening., Disp: , Rfl:    Misc Natural Products (BEET ROOT PO), Take 1,500 mg by mouth daily. 3 capsules PO daily, Disp: , Rfl:    Omega-3 Fatty Acids (ULTRA OMEGA 3 PO),  Take 2 tablets by mouth daily., Disp: , Rfl:    OVER THE COUNTER MEDICATION, Hydro eyes 2 tabs in the pm, Disp: , Rfl:    Probiotic Product (PROBIOTIC PO), Take 1 tablet by mouth daily.  , Disp: , Rfl:    progesterone (PROMETRIUM) 200 MG capsule, Take 200 mg by mouth every evening., Disp: , Rfl:    tirzepatide (MOUNJARO) 5 MG/0.5ML Pen, Inject 5 mg into the skin once a week., Disp: , Rfl:    Turmeric (QC TUMERIC COMPLEX PO), Take 1,500 mg by mouth. 2 capsules PO daily, Disp: , Rfl:    UNABLE TO FIND, Med Name: Resvoxitrol, Disp: , Rfl:    Vonoprazan Fumarate (VOQUEZNA) 10 MG TABS, Take by mouth., Disp: , Rfl:   PHYSICAL EXAM: ECOG FS:1 - Symptomatic but completely ambulatory    Vitals:   01/28/24 1351  BP: 121/66  Pulse: 70  Resp: 16  Temp: 97.6 F (36.4 C)  TempSrc: Temporal  SpO2: 100%  Weight: 143 lb 1.6 oz (64.9 kg)  Height: 5' 1.5" (1.562 m)   Physical Exam Vitals and nursing note reviewed.  Constitutional:      Appearance: She is not ill-appearing or toxic-appearing.  HENT:     Head: Normocephalic.  Eyes:     Conjunctiva/sclera: Conjunctivae normal.  Cardiovascular:     Rate and Rhythm: Normal rate and regular rhythm.     Pulses: Normal pulses.     Heart sounds: Normal heart sounds.  Pulmonary:     Effort: Pulmonary effort is normal.     Breath sounds: Normal breath sounds.  Abdominal:     General: There is no distension.  Musculoskeletal:      Cervical back: Normal range of motion.     Right lower leg: No edema.     Left lower leg: No edema.  Skin:    General: Skin is warm and dry.     Findings: No rash.  Neurological:     Mental Status: She is alert.        LABORATORY DATA: I have reviewed the data as listed    Latest Ref Rng & Units 01/28/2024    1:34 PM 01/19/2024    9:37 AM 03/22/2014   10:22 AM  CBC  WBC 4.0 - 10.5 K/uL 10.2  8.0  6.8   Hemoglobin 12.0 - 15.0 g/dL 57.8  46.9  62.9   Hematocrit 36.0 - 46.0 % 42.4  41.3  39.9   Platelets 150 - 400 K/uL 412  373  319         Latest Ref Rng & Units 01/28/2024    1:34 PM 01/19/2024    9:37 AM 03/22/2014   10:22 AM  CMP  Glucose 70 - 99 mg/dL 528  82  83   BUN 6 - 20 mg/dL 13  18  12    Creatinine 0.44 - 1.00 mg/dL 4.13  2.44  0.10   Sodium 135 - 145 mmol/L 139  139  138   Potassium 3.5 - 5.1 mmol/L 4.0  4.1  4.6   Chloride 98 - 111 mmol/L 107  108  105   CO2 22 - 32 mmol/L 29  27  21    Calcium 8.9 - 10.3 mg/dL 8.9  9.3  8.9   Total Protein 6.5 - 8.1 g/dL 6.6  6.4  6.0   Total Bilirubin 0.0 - 1.2 mg/dL 0.4  0.4  0.4   Alkaline Phos 38 - 126 U/L 60  61  49  AST 15 - 41 U/L 15  13  14    ALT 0 - 44 U/L 14  13  12         RADIOGRAPHIC STUDIES (from last 24 hours if applicable) I have personally reviewed the radiological images as listed and agreed with the findings in the report. No results found.      Visit Diagnosis: 1. Iron deficiency      No orders of the defined types were placed in this encounter.   All questions were answered. The patient knows to call the clinic with any problems, questions or concerns. No barriers to learning was detected.  A total of more than 20 minutes were spent on this encounter with face-to-face time and non-face-to-face time, including preparing to see the patient, ordering tests and/or medications, counseling the patient and coordination of care as outlined above.    Thank you for allowing me to participate in  the care of this patient.    Shanon Ace, PA-C Department of Hematology/Oncology [facility] Phone: [phone]  [phone]    01/28/2024 3:58 PM

## 2024-02-05 ENCOUNTER — Ambulatory Visit

## 2024-02-15 ENCOUNTER — Ambulatory Visit (INDEPENDENT_AMBULATORY_CARE_PROVIDER_SITE_OTHER)

## 2024-02-15 VITALS — BP 118/82 | HR 58 | Temp 97.9°F | Resp 16 | Ht 61.0 in | Wt 143.6 lb

## 2024-02-15 DIAGNOSIS — E611 Iron deficiency: Secondary | ICD-10-CM | POA: Diagnosis not present

## 2024-02-15 MED ORDER — IRON SUCROSE 20 MG/ML IV SOLN
200.0000 mg | Freq: Once | INTRAVENOUS | Status: AC
  Administered 2024-02-15: 200 mg via INTRAVENOUS
  Filled 2024-02-15: qty 10

## 2024-02-15 NOTE — Patient Instructions (Signed)
[id number] 

## 2024-02-15 NOTE — Progress Notes (Signed)
 Diagnosis: Iron Deficiency Anemia  Provider:  Chilton Greathouse MD  Procedure: IV Push  IV Type: Peripheral, IV Location: L Antecubital  Venofer (Iron Sucrose), Dose: 200 mg  Post Infusion IV Care: Observation period completed and Peripheral IV Discontinued  Discharge: Condition: Good, Destination: Home . AVS Declined  Performed by:  Adriana Mccallum, RN

## 2024-02-29 ENCOUNTER — Other Ambulatory Visit: Payer: Self-pay

## 2024-02-29 ENCOUNTER — Encounter: Payer: Self-pay | Admitting: Physician Assistant

## 2024-02-29 DIAGNOSIS — D72829 Elevated white blood cell count, unspecified: Secondary | ICD-10-CM

## 2024-02-29 DIAGNOSIS — E611 Iron deficiency: Secondary | ICD-10-CM

## 2024-02-29 DIAGNOSIS — D75839 Thrombocytosis, unspecified: Secondary | ICD-10-CM

## 2024-03-01 ENCOUNTER — Other Ambulatory Visit

## 2024-03-01 ENCOUNTER — Inpatient Hospital Stay: Attending: Physician Assistant

## 2024-03-01 DIAGNOSIS — D75839 Thrombocytosis, unspecified: Secondary | ICD-10-CM

## 2024-03-01 DIAGNOSIS — D72829 Elevated white blood cell count, unspecified: Secondary | ICD-10-CM

## 2024-03-01 DIAGNOSIS — E611 Iron deficiency: Secondary | ICD-10-CM | POA: Diagnosis present

## 2024-03-01 LAB — CBC WITH DIFFERENTIAL (CANCER CENTER ONLY)
Abs Immature Granulocytes: 0.01 10*3/uL (ref 0.00–0.07)
Basophils Absolute: 0 10*3/uL (ref 0.0–0.1)
Basophils Relative: 1 %
Eosinophils Absolute: 0.1 10*3/uL (ref 0.0–0.5)
Eosinophils Relative: 1 %
HCT: 44.5 % (ref 36.0–46.0)
Hemoglobin: 15.5 g/dL — ABNORMAL HIGH (ref 12.0–15.0)
Immature Granulocytes: 0 %
Lymphocytes Relative: 30 %
Lymphs Abs: 2.5 10*3/uL (ref 0.7–4.0)
MCH: 30.5 pg (ref 26.0–34.0)
MCHC: 34.8 g/dL (ref 30.0–36.0)
MCV: 87.6 fL (ref 80.0–100.0)
Monocytes Absolute: 0.5 10*3/uL (ref 0.1–1.0)
Monocytes Relative: 5 %
Neutro Abs: 5.2 10*3/uL (ref 1.7–7.7)
Neutrophils Relative %: 63 %
Platelet Count: 358 10*3/uL (ref 150–400)
RBC: 5.08 MIL/uL (ref 3.87–5.11)
RDW: 13 % (ref 11.5–15.5)
WBC Count: 8.3 10*3/uL (ref 4.0–10.5)
nRBC: 0 % (ref 0.0–0.2)

## 2024-03-01 LAB — RETIC PANEL
Immature Retic Fract: 7.6 % (ref 2.3–15.9)
RBC.: 5.09 MIL/uL (ref 3.87–5.11)
Retic Count, Absolute: 49.9 10*3/uL (ref 19.0–186.0)
Retic Ct Pct: 1 % (ref 0.4–3.1)
Reticulocyte Hemoglobin: 34.6 pg (ref >27.9)

## 2024-03-01 LAB — IRON AND IRON BINDING CAPACITY (CC-WL,HP ONLY)
Iron: 82 ug/dL (ref 28–170)
Saturation Ratios: 28 % (ref 10.4–31.8)
TIBC: 298 ug/dL (ref 250–450)
UIBC: 216 ug/dL (ref 148–442)

## 2024-03-01 LAB — FERRITIN: Ferritin: 116 ng/mL (ref 11–307)

## 2024-03-02 ENCOUNTER — Encounter: Payer: Self-pay | Admitting: Physician Assistant

## 2024-03-03 ENCOUNTER — Telehealth: Payer: Self-pay | Admitting: Physician Assistant

## 2024-03-11 ENCOUNTER — Telehealth: Payer: Self-pay | Admitting: Physician Assistant

## 2024-03-22 ENCOUNTER — Ambulatory Visit: Admitting: Physician Assistant

## 2024-03-22 ENCOUNTER — Other Ambulatory Visit

## 2024-04-05 ENCOUNTER — Encounter: Payer: Self-pay | Admitting: Physician Assistant

## 2024-05-31 ENCOUNTER — Other Ambulatory Visit

## 2024-05-31 ENCOUNTER — Ambulatory Visit: Admitting: Physician Assistant

## 2024-06-01 ENCOUNTER — Ambulatory Visit: Admitting: Physician Assistant

## 2024-06-01 ENCOUNTER — Other Ambulatory Visit

## 2024-06-29 ENCOUNTER — Other Ambulatory Visit: Payer: Self-pay | Admitting: Internal Medicine

## 2024-06-29 DIAGNOSIS — E039 Hypothyroidism, unspecified: Secondary | ICD-10-CM

## 2024-06-30 ENCOUNTER — Other Ambulatory Visit

## 2024-07-08 ENCOUNTER — Ambulatory Visit
Admission: RE | Admit: 2024-07-08 | Discharge: 2024-07-08 | Disposition: A | Discharge status: Home / Self Care | Source: Ambulatory Visit | Attending: Internal Medicine | Admitting: Internal Medicine

## 2024-07-08 DIAGNOSIS — E039 Hypothyroidism, unspecified: Secondary | ICD-10-CM

## 2024-08-23 ENCOUNTER — Ambulatory Visit (INDEPENDENT_AMBULATORY_CARE_PROVIDER_SITE_OTHER)

## 2024-08-23 ENCOUNTER — Encounter (HOSPITAL_BASED_OUTPATIENT_CLINIC_OR_DEPARTMENT_OTHER): Payer: Self-pay

## 2024-08-23 VITALS — BP 114/60 | HR 59 | Ht 61.0 in | Wt 134.0 lb

## 2024-08-23 DIAGNOSIS — G4719 Other hypersomnia: Secondary | ICD-10-CM

## 2024-08-23 NOTE — Patient Instructions (Signed)
 Complete home sleep test as ordered.  Follow up in 10 weeks for sleep test results.  Follow sleep hygiene as discussed

## 2024-08-23 NOTE — Progress Notes (Signed)
 @Patient  ID: [name], female    DOB: 30-Mar-1966, 58 y.o.   MRN: [id number]  Chief Complaint  Patient presents with   Establish Care    New sleep      Referring provider: Jesus Oliphant, MD  HPI: [name]. Kantor is a 58 year old female with past medical history of hypercholesterolemia who presents as a new patient for evaluation of sleep.  She reports that she usually goes to bed between 930 and 10:00 at night and wakes up around 615 every morning.  And only takes her a few minutes to fall asleep but she wakes up several times during the night.  She reports that her sleep is not restful and she wakes up every morning feeling very tired and has sleepiness throughout the day.  She is also had episodes of paroxysmal nocturnal dyspnea.  Her husband tells her that she snores and he has witnessed her not breathing at night.  She is currently on Mounjaro and has been for the last 2 years.  She reports about a 10 pound weight loss in the last 2 years but states this is mostly after removing gluten from her diet.  She is a never smoker and admits to rare alcohol intake.  She drinks 1 cup of caffeine in the morning but otherwise does not drink it throughout the day.  She reports a family history of sister with sleep apnea.  Review of systems positive for snoring, witnessed apneas, PND, daytime sleepiness, GERD, indigestion, difficulty swallowing.  She denies chest pain, irregular heartbeats, fever, chills, cough.  TEST/EVENTS : Epworth score of 7  Allergies  Allergen Reactions   Uribel  [Meth-Hyo-M Bl-Na Phos-Ph Sal]     Other reaction(s): Chest Pain   Biaxin [Clarithromycin]    Clarithromycin Diarrhea    Chest pain   Codeine Diarrhea    Chest pain   Doxycycline Diarrhea    Chest pain   Levofloxacin     diarrhea   Penicillins Diarrhea    Chest pain   Zetia [Ezetimibe] Other (See Comments)    Abdominal pain   Tape Rash    Sunburn like rash    Immunization History  Administered Date(s)  Administered   Influenza Split 08/17/2011   Influenza,inj,Quad PF,6+ Mos 08/09/2013   PFIZER(Purple Top)SARS-COV-2 Vaccination 12/30/2019, 01/20/2020    Past Medical History:  Diagnosis Date   Cholecystitis chronic, acute    Constipation    Dyspepsia    Elevated cholesterol    Fatigue    Fibroid    GERD (gastroesophageal reflux disease)    Goiter, nontoxic, multinodular    Hypothyroidism, acquired, autoimmune    Ovarian cyst, right    Thyroid  disease    Hypothyroid   Thyroiditis, autoimmune    Vitamin D  deficiency disease    Vulvodynia     Tobacco History: Social History   Tobacco Use  Smoking Status Never  Smokeless Tobacco Never   Counseling given: Not Answered   Outpatient Medications Prior to Visit  Medication Sig Dispense Refill   ARMOUR THYROID  30 MG tablet TK 1 T PO QD  5   Ascorbic Acid (VITAMIN C) 1000 MG tablet Take 1,000 mg by mouth daily.     B Complex-Biotin-FA (B COMPLETE) TABS Take 1 tablet by mouth daily.     Cholecalciferol (VITAMIN D3) 5000 units CAPS Take 1 capsule by mouth daily. With Vit K     Docusate Sodium (COLACE PO) Take by mouth.     EC-RX Testosterone 0.4 %  CREA Place onto the skin.     Estradiol  (IMVEXXY  MAINTENANCE PACK) 10 MCG INST Place vaginally.     estrogens, conjugated, (PREMARIN) 0.45 MG tablet Take 0.45 mg by mouth 2 (two) times a week. Take daily for 21 days then do not take for 7 days.     Lifitegrast (XIIDRA OP) Apply to eye.     MAGNESIUM OXIDE PO Take 1,000 mg by mouth every evening.     Misc Natural Products (BEET ROOT PO) Take 1,500 mg by mouth daily. 3 capsules PO daily     Omega-3 Fatty Acids (ULTRA OMEGA 3 PO) Take 2 tablets by mouth daily.     OVER THE COUNTER MEDICATION Hydro eyes 2 tabs in the pm     Probiotic Product (PROBIOTIC PO) Take 1 tablet by mouth daily.       progesterone (PROMETRIUM) 200 MG capsule Take 200 mg by mouth every evening.     tirzepatide (MOUNJARO) 5 MG/0.5ML Pen Inject 5 mg into the skin once  a week.     Turmeric (QC TUMERIC COMPLEX PO) Take 1,500 mg by mouth. 2 capsules PO daily     UNABLE TO FIND Med Name: Resvoxitrol     Vonoprazan Fumarate (VOQUEZNA) 10 MG TABS Take by mouth.     No facility-administered medications prior to visit.     Review of Systems: Positive as per HPI  Constitutional:   No  weight loss, night sweats,  Fevers, chills, fatigue, or  lassitude.  HEENT:   No headaches,  Difficulty swallowing,  Tooth/dental problems, or  Sore throat,                No sneezing, itching, ear ache, nasal congestion, post nasal drip,   CV:  No chest pain,  Orthopnea, PND, swelling in lower extremities, anasarca, dizziness, palpitations, syncope.   GI  No heartburn, indigestion, abdominal pain, nausea, vomiting, diarrhea, change in bowel habits, loss of appetite, bloody stools.   Resp: No shortness of breath with exertion or at rest.  No excess mucus, no productive cough,  No non-productive cough,  No coughing up of blood.  No change in color of mucus.  No wheezing.  No chest wall deformity  Skin: no rash or lesions.  GU: no dysuria, change in color of urine, no urgency or frequency.  No flank pain, no hematuria   MS:  No joint pain or swelling.  No decreased range of motion.  No back pain.    Physical Exam  BP 114/60   Pulse (!) 59   Ht 5' 1 (1.549 m)   Wt 134 lb (60.8 kg)   SpO2 99%   BMI 25.32 kg/m   GEN: A/Ox3; pleasant , NAD, well nourished    HEENT:  [place]/AT,  EACs-clear, TMs-wnl, NOSE-clear, THROAT-clear, no lesions, no postnasal drip or exudate noted.  Mallampati 3  NECK:  Supple w/ fair ROM; no JVD; normal carotid impulses w/o bruits; no thyromegaly or nodules palpated; no lymphadenopathy.    RESP  Clear  P & A; w/o, wheezes/ rales/ or rhonchi. no accessory muscle use, no dullness to percussion  CARD:  RRR, no m/r/g, no peripheral edema, pulses intact, no cyanosis or clubbing.  GI:   Soft & nt; nml bowel sounds; no organomegaly or masses detected.    Musco: Warm bil, no deformities or joint swelling noted.   Neuro: alert, no focal deficits noted.    Skin: Warm, no lesions or rashes    Lab Results:  CBC    Component Value Date/Time   WBC 8.3 03/01/2024 1139   WBC 6.8 03/22/2014 1022   RBC 5.09 03/01/2024 1139   RBC 5.08 03/01/2024 1139   HGB 15.5 (H) 03/01/2024 1139   HCT 44.5 03/01/2024 1139   PLT 358 03/01/2024 1139   MCV 87.6 03/01/2024 1139   MCH 30.5 03/01/2024 1139   MCHC 34.8 03/01/2024 1139   RDW 13.0 03/01/2024 1139   LYMPHSABS 2.5 03/01/2024 1139   MONOABS 0.5 03/01/2024 1139   EOSABS 0.1 03/01/2024 1139   BASOSABS 0.0 03/01/2024 1139    BMET    Component Value Date/Time   NA 139 01/28/2024 1334   K 4.0 01/28/2024 1334   CL 107 01/28/2024 1334   CO2 29 01/28/2024 1334   GLUCOSE 112 (H) 01/28/2024 1334   BUN 13 01/28/2024 1334   CREATININE 0.69 01/28/2024 1334   CREATININE 0.62 03/22/2014 1022   CALCIUM 8.9 01/28/2024 1334   CALCIUM 9.3 04/08/2013 0825   GFRNONAA >60 01/28/2024 1334    BNP No results found for: BNP  ProBNP No results found for: PROBNP  Imaging: No results found.  Administration History     None           No data to display          No results found for: NITRICOXIDE   Assessment & Plan:  [name] is a 58 year old female with past medical history of hypercholesterolemia who presents as a new patient for evaluation of sleep.  Symptoms of PND, snoring, witnessed apneas, and excessive daytime sleepiness are all concerning for sleep apnea. Assessment & Plan Excessive daytime sleepiness - Plan for home sleep test to further evaluate. -Follow sleep hygiene as discussed. -Informational packet on sleep apnea and the sleep test were attached to today's AVS.    Return in about 10 weeks (around 11/01/2024) for sleep study review.  Candis Dandy, PA-C 08/23/2024

## 2024-08-26 ENCOUNTER — Other Ambulatory Visit: Payer: Self-pay | Admitting: Obstetrics and Gynecology

## 2024-08-26 DIAGNOSIS — R1909 Other intra-abdominal and pelvic swelling, mass and lump: Secondary | ICD-10-CM

## 2024-08-29 ENCOUNTER — Ambulatory Visit
Admission: RE | Admit: 2024-08-29 | Discharge: 2024-08-29 | Disposition: A | Discharge status: Home / Self Care | Source: Ambulatory Visit | Attending: Obstetrics and Gynecology | Admitting: Obstetrics and Gynecology

## 2024-08-29 DIAGNOSIS — R1909 Other intra-abdominal and pelvic swelling, mass and lump: Secondary | ICD-10-CM

## 2024-09-18 ENCOUNTER — Encounter

## 2024-09-18 DIAGNOSIS — G4719 Other hypersomnia: Secondary | ICD-10-CM

## 2024-10-02 ENCOUNTER — Telehealth: Payer: Self-pay | Admitting: Pulmonary Disease

## 2024-10-02 DIAGNOSIS — G4733 Obstructive sleep apnea (adult) (pediatric): Secondary | ICD-10-CM | POA: Diagnosis not present

## 2024-10-02 NOTE — Telephone Encounter (Signed)
 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  Saturations was below 88% for 0.5 minutes   Recommendation:  Treatment Options  Consider continuous positive airway pressure (CPAP) therapy for individuals experiencing significant daytime sleepiness or those with comorbid conditions, such as a history of stroke or cardiac disease. If CPAP is selected, an auto-titrating device set between 5 and 15 cm H2O is recommended.  For some patients, a conservative approach may be appropriate. This includes strategies such as weight loss, adjusting sleep position to encourage lateral (side) sleeping, and elevating the head of the bed by approximately 30 degrees.  An oral device may be considered for the treatment of mild sleep-disordered breathing. This option requires referral to a dentist for assessment and fitting. Follow-Up Continue with follow-up as previously scheduled to monitor progress and response to therapy.

## 2024-10-03 NOTE — Telephone Encounter (Signed)
 I called and spoke to pt. Pt informed of Dr Cathye note and verbalized understanding. Pt was informed of the treatment options which I went over with pt and pt was not sure what to do and wanted the recommendations. I had already went over the recommendations and pt was still confused. Pt asked for a sooner f/u to discuss this with a provider instead. Pt was scheduled top see Candis tomorrow. NFN

## 2024-10-04 ENCOUNTER — Ambulatory Visit (INDEPENDENT_AMBULATORY_CARE_PROVIDER_SITE_OTHER)

## 2024-10-04 ENCOUNTER — Encounter (HOSPITAL_BASED_OUTPATIENT_CLINIC_OR_DEPARTMENT_OTHER): Payer: Self-pay

## 2024-10-04 VITALS — BP 144/85 | HR 68 | Ht 61.0 in | Wt 134.7 lb

## 2024-10-04 DIAGNOSIS — G4733 Obstructive sleep apnea (adult) (pediatric): Secondary | ICD-10-CM

## 2024-10-04 NOTE — Progress Notes (Signed)
 @Patient  ID: [name], female    DOB: 05/24/1966, 58 y.o.   MRN: [id number]  Chief Complaint  Patient presents with   Sleep Apnea    Discuss sleep study results    Referring provider: Theo Iha, MD  HPI: Discussed the use of AI scribe software for clinical note transcription with the patient, who gave verbal consent to proceed.  History of Present Illness [name] is a 58 year old female who presents for review of her sleep study results. She is accompanied by her husband, [name]. She was referred by an ENT specialist for a sleep study due to concerns about sleep apnea.  She experiences loud snoring, and her husband has noticed 'different sounds' during her sleep, which prompted the sleep study.  She underwent a home sleep study which demonstrated an AHI of 5/hr with O2 sat nadir of 84%.   She is not interested in using a CPAP machine due to claustrophobia and concerns about travel. She is considering alternative treatments such as a mandibular advancement device (mouthpiece).  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 She is concerned about the potential impact of a mouthpiece on her TMJ history.  She never wakes up feeling rested, regardless of the amount of sleep, and rarely feels ready to start the day. Despite practicing sleep hygiene, including not sleeping on her back, she continues to experience these symptoms.  Her social history includes minimal alcohol consumption. She watches TV and scrolls on her phone in bed, which may affect her sleep.    Last OV 08/23/2024: [name] is a 58 year old female with past medical history of hypercholesterolemia who presents as a new patient for evaluation of sleep.  She reports that she usually goes to bed between 930 and 10:00 at night and wakes up around 615 every morning.  And only takes her a few minutes to fall asleep but she wakes up several times during the  night.  She reports that her sleep is not restful and she wakes up every morning feeling very tired and has sleepiness throughout the day.  She is also had episodes of paroxysmal nocturnal dyspnea.  Her husband tells her that she snores and he has witnessed her not breathing at night.  She is currently on Mounjaro and has been for the last 2 years.  She reports about a 10 pound weight loss in the last 2 years but states this is mostly after removing gluten from her diet.  She is a never smoker and admits to rare alcohol intake.  She drinks 1 cup of caffeine in the morning but otherwise does not drink it throughout the day.  She reports a family history of sister with sleep apnea.  Review of systems positive for snoring, witnessed apneas, PND, daytime sleepiness, GERD, indigestion, difficulty swallowing.  She denies chest pain, irregular heartbeats, fever, chills, cough.   TEST/EVENTS : Epworth score of 7  HST 09/18/2024:  AHI 5/hr with O2 sat nadir 84%  Allergies  Allergen Reactions   Uribel  [Meth-Hyo-M Bl-Na Phos-Ph Sal]     Other reaction(s): Chest Pain   Biaxin [Clarithromycin]    Clarithromycin Diarrhea    Chest pain   Codeine Diarrhea    Chest pain   Doxycycline Diarrhea    Chest pain   Levofloxacin     diarrhea   Penicillins Diarrhea    Chest pain   Zetia [Ezetimibe] Other (See  Comments)    Abdominal pain   Tape Rash    Sunburn like rash    Immunization History  Administered Date(s) Administered   Influenza Split 08/17/2011   Influenza,inj,Quad PF,6+ Mos 08/09/2013   PFIZER(Purple Top)SARS-COV-2 Vaccination 12/30/2019, 01/20/2020    Past Medical History:  Diagnosis Date   Cholecystitis chronic, acute    Constipation    Dyspepsia    Elevated cholesterol    Fatigue    Fibroid    GERD (gastroesophageal reflux disease)    Goiter, nontoxic, multinodular    Hypothyroidism, acquired, autoimmune    Ovarian cyst, right    Thyroid  disease    Hypothyroid   Thyroiditis,  autoimmune    Vitamin D  deficiency disease    Vulvodynia     Tobacco History: Social History   Tobacco Use  Smoking Status Never  Smokeless Tobacco Never   Counseling given: Not Answered   Outpatient Medications Prior to Visit  Medication Sig Dispense Refill   ARMOUR THYROID  30 MG tablet TK 1 T PO QD  5   Ascorbic Acid (VITAMIN C) 1000 MG tablet Take 1,000 mg by mouth daily.     B Complex-Biotin-FA (B COMPLETE) TABS Take 1 tablet by mouth daily.     Cholecalciferol (VITAMIN D3) 5000 units CAPS Take 1 capsule by mouth daily. With Vit K     EC-RX Testosterone 0.4 % CREA Place onto the skin.     Estradiol  (IMVEXXY  MAINTENANCE PACK) 10 MCG INST Place vaginally.     Lifitegrast (XIIDRA OP) Apply to eye.     MAGNESIUM OXIDE PO Take 1,000 mg by mouth every evening.     Misc Natural Products (BEET ROOT PO) Take 1,500 mg by mouth daily. 3 capsules PO daily     Omega-3 Fatty Acids (ULTRA OMEGA 3 PO) Take 2 tablets by mouth daily.     OVER THE COUNTER MEDICATION Hydro eyes 2 tabs in the pm     progesterone (PROMETRIUM) 200 MG capsule Take 200 mg by mouth every evening. (Patient taking differently: Take 100 mg by mouth every evening.)     tirzepatide (MOUNJARO) 5 MG/0.5ML Pen Inject 5 mg into the skin once a week. (Patient taking differently: Inject 0.25 mg into the skin once a week.)     Docusate Sodium (COLACE PO) Take by mouth. (Patient not taking: Reported on 10/04/2024)     estrogens, conjugated, (PREMARIN) 0.45 MG tablet Take 0.45 mg by mouth 2 (two) times a week. Take daily for 21 days then do not take for 7 days. (Patient not taking: Reported on 10/04/2024)     Probiotic Product (PROBIOTIC PO) Take 1 tablet by mouth daily.   (Patient not taking: Reported on 10/04/2024)     Turmeric (QC TUMERIC COMPLEX PO) Take 1,500 mg by mouth. 2 capsules PO daily (Patient not taking: Reported on 10/04/2024)     UNABLE TO FIND Med Name: Resvoxitrol (Patient not taking: Reported on 10/04/2024)      Vonoprazan Fumarate (VOQUEZNA) 10 MG TABS Take by mouth. (Patient not taking: Reported on 10/04/2024)     No facility-administered medications prior to visit.     Review of Systems:   Constitutional:   No  weight loss, night sweats,  Fevers, chills, fatigue, or  lassitude.  HEENT:   No headaches,  Difficulty swallowing,  Tooth/dental problems, or  Sore throat,                No sneezing, itching, ear ache, nasal congestion, post nasal drip,  CV:  No chest pain,  Orthopnea, PND, swelling in lower extremities, anasarca, dizziness, palpitations, syncope.   GI  No heartburn, indigestion, abdominal pain, nausea, vomiting, diarrhea, change in bowel habits, loss of appetite, bloody stools.   Resp: No shortness of breath with exertion or at rest.  No excess mucus, no productive cough,  No non-productive cough,  No coughing up of blood.  No change in color of mucus.  No wheezing.  No chest wall deformity  Skin: no rash or lesions.  GU: no dysuria, change in color of urine, no urgency or frequency.  No flank pain, no hematuria   MS:  No joint pain or swelling.  No decreased range of motion.  No back pain.    Physical Exam  BP (!) 144/85   Pulse 68   Ht 5' 1 (1.549 m)   Wt 134 lb 11.2 oz (61.1 kg)   SpO2 94%   BMI 25.45 kg/m   GEN: A/Ox3; pleasant , NAD, well nourished    HEENT:  [place]/AT,  EACs-clear, TMs-wnl, NOSE-clear, THROAT-clear, no lesions, no postnasal drip or exudate noted.   NECK:  Supple w/ fair ROM; no JVD; normal carotid impulses w/o bruits; no thyromegaly or nodules palpated; no lymphadenopathy.    RESP  Clear  P & A; w/o, wheezes/ rales/ or rhonchi. no accessory muscle use, no dullness to percussion  CARD:  RRR, no m/r/g, no peripheral edema, pulses intact, no cyanosis or clubbing.  GI:   Soft & nt; nml bowel sounds; no organomegaly or masses detected.   Musco: Warm bil, no deformities or joint swelling noted.   Neuro: alert, no focal deficits noted.    Skin:  Warm, no lesions or rashes    Lab Results:  CBC    Component Value Date/Time   WBC 8.3 03/01/2024 1139   WBC 6.8 03/22/2014 1022   RBC 5.09 03/01/2024 1139   RBC 5.08 03/01/2024 1139   HGB 15.5 (H) 03/01/2024 1139   HCT 44.5 03/01/2024 1139   PLT 358 03/01/2024 1139   MCV 87.6 03/01/2024 1139   MCH 30.5 03/01/2024 1139   MCHC 34.8 03/01/2024 1139   RDW 13.0 03/01/2024 1139   LYMPHSABS 2.5 03/01/2024 1139   MONOABS 0.5 03/01/2024 1139   EOSABS 0.1 03/01/2024 1139   BASOSABS 0.0 03/01/2024 1139    BMET    Component Value Date/Time   NA 139 01/28/2024 1334   K 4.0 01/28/2024 1334   CL 107 01/28/2024 1334   CO2 29 01/28/2024 1334   GLUCOSE 112 (H) 01/28/2024 1334   BUN 13 01/28/2024 1334   CREATININE 0.69 01/28/2024 1334   CREATININE 0.62 03/22/2014 1022   CALCIUM 8.9 01/28/2024 1334   CALCIUM 9.3 04/08/2013 0825   GFRNONAA >60 01/28/2024 1334    BNP No results found for: BNP  ProBNP No results found for: PROBNP  Imaging: No results found.  Administration History     None           No data to display          No results found for: NITRICOXIDE   Assessment & Plan:   Assessment & Plan OSA (obstructive sleep apnea)  Assessment and Plan Assessment & Plan Obstructive sleep apnea, mild Mild obstructive sleep apnea with AHI of 5. Prefers mouthpiece over CPAP due to claustrophobia and travel concerns. No anatomical issues per ENT. Discussed mouthpiece as alternative treatment and potential cardiovascular risks if OSA is untreated. - Referred to orthodontics for mouthpiece  fitting. - Advised continuation of sleep hygiene: sleep on side, elevate head, avoid alcohol and caffeine before bed, avoid TV and screens in bed. - Plan repeat sleep study after weeks of mouthpiece use to assess effectiveness. - Maintain December appointment as placeholder, reschedule if mouthpiece not obtained.    Return for as scheduled on 12/22.  Candis Dandy,  PA-C 10/04/2024

## 2024-10-04 NOTE — Patient Instructions (Signed)
 Orthodontics referral placed today for mouthpiece fitting.  Continue sleep hygiene as discussed.  Follow up as scheduled; reschedule if mouthpiece fitting not completed prior to next f/u as scheduled (12/22)

## 2024-10-12 ENCOUNTER — Encounter (HOSPITAL_BASED_OUTPATIENT_CLINIC_OR_DEPARTMENT_OTHER): Payer: Self-pay

## 2024-11-07 ENCOUNTER — Ambulatory Visit (HOSPITAL_BASED_OUTPATIENT_CLINIC_OR_DEPARTMENT_OTHER)

## 2024-11-08 ENCOUNTER — Other Ambulatory Visit (HOSPITAL_BASED_OUTPATIENT_CLINIC_OR_DEPARTMENT_OTHER): Payer: Self-pay

## 2024-11-08 MED ORDER — ESTRADIOL 0.05 MG/24HR TD PTTW
1.0000 | MEDICATED_PATCH | TRANSDERMAL | 11 refills | Status: AC
  Filled 2024-11-08: qty 8, 28d supply, fill #0
  Filled 2024-12-01: qty 8, 28d supply, fill #1

## 2024-11-09 ENCOUNTER — Other Ambulatory Visit (HOSPITAL_BASED_OUTPATIENT_CLINIC_OR_DEPARTMENT_OTHER): Payer: Self-pay

## 2024-12-02 ENCOUNTER — Other Ambulatory Visit (HOSPITAL_BASED_OUTPATIENT_CLINIC_OR_DEPARTMENT_OTHER): Payer: Self-pay

## 2024-12-14 ENCOUNTER — Ambulatory Visit (HOSPITAL_BASED_OUTPATIENT_CLINIC_OR_DEPARTMENT_OTHER)

## 2025-01-18 ENCOUNTER — Ambulatory Visit (HOSPITAL_BASED_OUTPATIENT_CLINIC_OR_DEPARTMENT_OTHER)

## 2025-03-14 ENCOUNTER — Ambulatory Visit (HOSPITAL_BASED_OUTPATIENT_CLINIC_OR_DEPARTMENT_OTHER)
